# Patient Record
Sex: Female | Born: 1950
Health system: Southern US, Community
[De-identification: ages and names within clinical notes are randomized; demographics above are authoritative.]

## PROBLEM LIST (undated history)

## (undated) DIAGNOSIS — M539 Dorsopathy, unspecified: Secondary | ICD-10-CM

## (undated) DIAGNOSIS — K30 Functional dyspepsia: Secondary | ICD-10-CM

## (undated) DIAGNOSIS — I10 Essential (primary) hypertension: Secondary | ICD-10-CM

## (undated) DIAGNOSIS — R42 Dizziness and giddiness: Secondary | ICD-10-CM

## (undated) DIAGNOSIS — M1990 Unspecified osteoarthritis, unspecified site: Secondary | ICD-10-CM

## (undated) DIAGNOSIS — E119 Type 2 diabetes mellitus without complications: Secondary | ICD-10-CM

## (undated) DIAGNOSIS — U071 COVID-19: Secondary | ICD-10-CM

## (undated) DIAGNOSIS — M109 Gout, unspecified: Secondary | ICD-10-CM

## (undated) DIAGNOSIS — D649 Anemia, unspecified: Secondary | ICD-10-CM

## (undated) DIAGNOSIS — Z87898 Personal history of other specified conditions: Secondary | ICD-10-CM

## (undated) DIAGNOSIS — E7800 Pure hypercholesterolemia, unspecified: Secondary | ICD-10-CM

## (undated) HISTORY — DX: Gout, unspecified: M10.9

## (undated) HISTORY — DX: Type 2 diabetes mellitus without complications: E11.9

## (undated) HISTORY — DX: Unspecified osteoarthritis, unspecified site: M19.90

## (undated) HISTORY — DX: Essential (primary) hypertension: I10

## (undated) HISTORY — PX: INCONTINENCE SURGERY: SHX676

## (undated) HISTORY — PX: ABDOMINAL HYSTERECTOMY: SHX81

## (undated) HISTORY — DX: Functional dyspepsia: K30

## (undated) HISTORY — PX: CHOLECYSTECTOMY: SHX55

## (undated) HISTORY — DX: Personal history of other specified conditions: Z87.898

## (undated) HISTORY — PX: COLONOSCOPY: SHX174

---

## 2004-12-27 ENCOUNTER — Ambulatory Visit: Payer: Self-pay

## 2005-12-29 ENCOUNTER — Ambulatory Visit: Payer: Self-pay | Admitting: Family Medicine

## 2006-08-22 ENCOUNTER — Ambulatory Visit: Payer: Self-pay | Admitting: Family Medicine

## 2006-12-20 ENCOUNTER — Ambulatory Visit: Payer: Self-pay | Admitting: Family Medicine

## 2007-05-10 ENCOUNTER — Emergency Department: Payer: Self-pay | Admitting: Emergency Medicine

## 2007-05-10 ENCOUNTER — Other Ambulatory Visit: Payer: Self-pay

## 2007-05-14 ENCOUNTER — Ambulatory Visit: Payer: Self-pay | Admitting: Family Medicine

## 2007-10-16 ENCOUNTER — Ambulatory Visit: Payer: Self-pay | Admitting: Family Medicine

## 2007-10-18 ENCOUNTER — Ambulatory Visit: Payer: Self-pay | Admitting: Unknown Physician Specialty

## 2007-10-30 ENCOUNTER — Ambulatory Visit: Payer: Self-pay | Admitting: Family Medicine

## 2007-11-26 ENCOUNTER — Ambulatory Visit: Payer: Self-pay | Admitting: Unknown Physician Specialty

## 2007-12-24 ENCOUNTER — Ambulatory Visit: Payer: Self-pay | Admitting: Unknown Physician Specialty

## 2007-12-31 ENCOUNTER — Ambulatory Visit: Payer: Self-pay | Admitting: Unknown Physician Specialty

## 2008-01-01 ENCOUNTER — Ambulatory Visit: Payer: Self-pay | Admitting: Family Medicine

## 2008-10-25 ENCOUNTER — Emergency Department: Payer: Self-pay | Admitting: Internal Medicine

## 2009-01-20 ENCOUNTER — Ambulatory Visit: Payer: Self-pay | Admitting: Family Medicine

## 2009-02-02 ENCOUNTER — Ambulatory Visit: Payer: Self-pay | Admitting: Internal Medicine

## 2009-02-24 ENCOUNTER — Ambulatory Visit: Payer: Self-pay | Admitting: Internal Medicine

## 2009-03-04 ENCOUNTER — Ambulatory Visit: Payer: Self-pay | Admitting: Internal Medicine

## 2009-03-24 ENCOUNTER — Ambulatory Visit: Payer: Self-pay | Admitting: Internal Medicine

## 2009-04-04 ENCOUNTER — Ambulatory Visit: Payer: Self-pay | Admitting: Internal Medicine

## 2009-04-24 ENCOUNTER — Ambulatory Visit: Payer: Self-pay | Admitting: Internal Medicine

## 2009-06-02 ENCOUNTER — Ambulatory Visit: Payer: Self-pay | Admitting: Internal Medicine

## 2009-06-08 ENCOUNTER — Encounter: Payer: Self-pay | Admitting: Sports Medicine

## 2009-06-09 ENCOUNTER — Ambulatory Visit: Payer: Self-pay | Admitting: Sports Medicine

## 2009-06-16 ENCOUNTER — Ambulatory Visit: Payer: Self-pay | Admitting: Internal Medicine

## 2009-07-03 ENCOUNTER — Ambulatory Visit: Payer: Self-pay | Admitting: Internal Medicine

## 2009-08-02 ENCOUNTER — Ambulatory Visit: Payer: Self-pay | Admitting: Internal Medicine

## 2009-08-04 ENCOUNTER — Other Ambulatory Visit: Payer: Self-pay | Admitting: Internal Medicine

## 2009-08-28 ENCOUNTER — Other Ambulatory Visit: Payer: Self-pay | Admitting: Physician Assistant

## 2009-09-02 ENCOUNTER — Ambulatory Visit: Payer: Self-pay | Admitting: Internal Medicine

## 2009-09-08 ENCOUNTER — Ambulatory Visit: Payer: Self-pay | Admitting: Internal Medicine

## 2009-10-02 ENCOUNTER — Ambulatory Visit: Payer: Self-pay | Admitting: Internal Medicine

## 2009-10-26 ENCOUNTER — Emergency Department: Payer: Self-pay | Admitting: Emergency Medicine

## 2009-10-29 ENCOUNTER — Ambulatory Visit: Payer: Self-pay | Admitting: Internal Medicine

## 2009-10-30 ENCOUNTER — Ambulatory Visit: Payer: Self-pay | Admitting: Internal Medicine

## 2009-11-02 ENCOUNTER — Ambulatory Visit: Payer: Self-pay | Admitting: Internal Medicine

## 2009-12-01 ENCOUNTER — Ambulatory Visit: Payer: Self-pay | Admitting: Internal Medicine

## 2009-12-03 ENCOUNTER — Ambulatory Visit: Payer: Self-pay | Admitting: Internal Medicine

## 2009-12-14 ENCOUNTER — Other Ambulatory Visit: Payer: Self-pay | Admitting: Internal Medicine

## 2010-01-25 ENCOUNTER — Ambulatory Visit: Payer: Self-pay

## 2010-04-06 ENCOUNTER — Ambulatory Visit: Payer: Self-pay | Admitting: Internal Medicine

## 2010-04-06 ENCOUNTER — Other Ambulatory Visit: Payer: Self-pay | Admitting: Internal Medicine

## 2010-05-05 ENCOUNTER — Ambulatory Visit: Payer: Self-pay | Admitting: Internal Medicine

## 2010-06-23 ENCOUNTER — Ambulatory Visit: Payer: Self-pay | Admitting: Internal Medicine

## 2010-07-04 ENCOUNTER — Ambulatory Visit: Payer: Self-pay | Admitting: Internal Medicine

## 2010-08-03 ENCOUNTER — Ambulatory Visit: Payer: Self-pay | Admitting: Internal Medicine

## 2010-12-07 ENCOUNTER — Ambulatory Visit: Payer: Self-pay | Admitting: Internal Medicine

## 2011-01-03 ENCOUNTER — Ambulatory Visit: Payer: Self-pay | Admitting: Internal Medicine

## 2011-02-07 ENCOUNTER — Ambulatory Visit: Payer: Self-pay | Admitting: Internal Medicine

## 2011-07-09 ENCOUNTER — Other Ambulatory Visit: Payer: Self-pay | Admitting: Internal Medicine

## 2011-07-09 LAB — CBC WITH DIFFERENTIAL/PLATELET
Basophil #: 0.1 10*3/uL (ref 0.0–0.1)
Eosinophil #: 0.2 10*3/uL (ref 0.0–0.7)
HGB: 11.1 g/dL — ABNORMAL LOW (ref 12.0–16.0)
MCH: 28.1 pg (ref 26.0–34.0)
MCHC: 32 g/dL (ref 32.0–36.0)
MCV: 88 fL (ref 80–100)
Monocyte #: 0.6 10*3/uL (ref 0.0–0.7)
Monocyte %: 4.5 %
Neutrophil %: 64.8 %
Platelet: 426 10*3/uL (ref 150–440)
RBC: 3.95 10*6/uL (ref 3.80–5.20)
WBC: 13 10*3/uL — ABNORMAL HIGH (ref 3.6–11.0)

## 2011-07-09 LAB — BASIC METABOLIC PANEL
Anion Gap: 8 (ref 7–16)
BUN: 14 mg/dL (ref 7–18)
Calcium, Total: 9 mg/dL (ref 8.5–10.1)
Chloride: 101 mmol/L (ref 98–107)
Co2: 28 mmol/L (ref 21–32)
EGFR (African American): >60
Osmolality: 276 (ref 275–301)
Potassium: 4.4 mmol/L (ref 3.5–5.1)
Sodium: 137 mmol/L (ref 136–145)

## 2011-07-09 LAB — HEPATIC FUNCTION PANEL A (ARMC)
Albumin: 4.4 g/dL (ref 3.4–5.0)
Bilirubin, Direct: 0.1 mg/dL (ref 0.00–0.20)
Bilirubin,Total: 0.5 mg/dL (ref 0.2–1.0)
SGPT (ALT): 44 U/L
Total Protein: 8.1 g/dL (ref 6.4–8.2)

## 2011-07-09 LAB — LIPID PANEL
HDL Cholesterol: 55 mg/dL (ref 40–60)
Ldl Cholesterol, Calc: 70 mg/dL (ref 0–100)
VLDL Cholesterol, Calc: 28 mg/dL (ref 5–40)

## 2011-07-12 LAB — HEMOGLOBIN A1C

## 2011-07-15 ENCOUNTER — Emergency Department: Payer: Self-pay | Admitting: Internal Medicine

## 2011-07-15 LAB — URINALYSIS, COMPLETE
Bilirubin,UR: NEGATIVE
Blood: NEGATIVE
Hyaline Cast: 74
Ketone: NEGATIVE
Nitrite: NEGATIVE
Ph: 5 (ref 4.5–8.0)
RBC,UR: 9 /HPF (ref 0–5)
Specific Gravity: 1.02 (ref 1.003–1.030)

## 2011-07-15 LAB — CBC
HCT: 39.6 % (ref 35.0–47.0)
MCV: 86 fL (ref 80–100)
Platelet: 461 10*3/uL — ABNORMAL HIGH (ref 150–440)
RBC: 4.63 10*6/uL (ref 3.80–5.20)
RDW: 14.8 % — ABNORMAL HIGH (ref 11.5–14.5)
WBC: 12.5 10*3/uL — ABNORMAL HIGH (ref 3.6–11.0)

## 2011-07-15 LAB — COMPREHENSIVE METABOLIC PANEL
Alkaline Phosphatase: 134 U/L (ref 50–136)
BUN: 16 mg/dL (ref 7–18)
Bilirubin,Total: 0.9 mg/dL (ref 0.2–1.0)
Calcium, Total: 9.2 mg/dL (ref 8.5–10.1)
Chloride: 94 mmol/L — ABNORMAL LOW (ref 98–107)
Co2: 27 mmol/L (ref 21–32)
Creatinine: 0.85 mg/dL (ref 0.60–1.30)
EGFR (African American): >60
Glucose: 181 mg/dL — ABNORMAL HIGH (ref 65–99)
Osmolality: 272 (ref 275–301)
Potassium: 3.5 mmol/L (ref 3.5–5.1)
SGPT (ALT): 74 U/L
Sodium: 133 mmol/L — ABNORMAL LOW (ref 136–145)
Total Protein: 8 g/dL (ref 6.4–8.2)

## 2012-01-24 ENCOUNTER — Other Ambulatory Visit: Payer: Self-pay | Admitting: Internal Medicine

## 2012-01-24 LAB — CBC WITH DIFFERENTIAL/PLATELET
Basophil %: 0.6 %
Eosinophil #: 0.2 10*3/uL (ref 0.0–0.7)
Eosinophil %: 1.3 %
HCT: 36 % (ref 35.0–47.0)
HGB: 11.6 g/dL — ABNORMAL LOW (ref 12.0–16.0)
Lymphocyte #: 3.3 10*3/uL (ref 1.0–3.6)
MCH: 28.8 pg (ref 26.0–34.0)
MCV: 90 fL (ref 80–100)
Monocyte #: 0.5 x10 3/mm (ref 0.2–0.9)
Monocyte %: 4.6 %
Neutrophil #: 7.4 10*3/uL — ABNORMAL HIGH (ref 1.4–6.5)
Platelet: 391 10*3/uL (ref 150–440)
RBC: 4.02 10*6/uL (ref 3.80–5.20)
WBC: 11.4 10*3/uL — ABNORMAL HIGH (ref 3.6–11.0)

## 2012-01-24 LAB — TSH: Thyroid Stimulating Horm: 0.787 u[IU]/mL

## 2012-01-24 LAB — URINALYSIS, COMPLETE
Bilirubin,UR: NEGATIVE
Blood: NEGATIVE
Ketone: NEGATIVE
Leukocyte Esterase: NEGATIVE
Ph: 8 (ref 4.5–8.0)
RBC,UR: 1 /HPF (ref 0–5)
Specific Gravity: 1.012 (ref 1.003–1.030)
WBC UR: 1 /HPF (ref 0–5)

## 2012-01-24 LAB — HEPATIC FUNCTION PANEL A (ARMC)
Albumin: 4.1 g/dL (ref 3.4–5.0)
Alkaline Phosphatase: 109 U/L (ref 50–136)
SGOT(AST): 35 U/L (ref 15–37)
SGPT (ALT): 29 U/L (ref 12–78)
Total Protein: 7.6 g/dL (ref 6.4–8.2)

## 2012-01-24 LAB — BASIC METABOLIC PANEL
Anion Gap: 8 (ref 7–16)
BUN: 10 mg/dL (ref 7–18)
Calcium, Total: 9 mg/dL (ref 8.5–10.1)
Glucose: 129 mg/dL — ABNORMAL HIGH (ref 65–99)
Potassium: 4.1 mmol/L (ref 3.5–5.1)

## 2012-01-24 LAB — LIPID PANEL
HDL Cholesterol: 47 mg/dL (ref 40–60)
Triglycerides: 155 mg/dL (ref 0–200)
VLDL Cholesterol, Calc: 31 mg/dL (ref 5–40)

## 2012-01-24 LAB — HEMOGLOBIN A1C: Hemoglobin A1C: 8.4 % — ABNORMAL HIGH (ref 4.2–6.3)

## 2012-03-06 ENCOUNTER — Ambulatory Visit: Payer: Self-pay | Admitting: Internal Medicine

## 2012-03-07 ENCOUNTER — Ambulatory Visit: Payer: Self-pay | Admitting: Internal Medicine

## 2012-09-18 ENCOUNTER — Ambulatory Visit: Payer: Self-pay | Admitting: Surgery

## 2013-04-02 ENCOUNTER — Ambulatory Visit: Payer: Self-pay | Admitting: Internal Medicine

## 2014-01-02 DIAGNOSIS — G40109 Localization-related (focal) (partial) symptomatic epilepsy and epileptic syndromes with simple partial seizures, not intractable, without status epilepticus: Secondary | ICD-10-CM | POA: Insufficient documentation

## 2014-01-02 DIAGNOSIS — F419 Anxiety disorder, unspecified: Secondary | ICD-10-CM | POA: Insufficient documentation

## 2014-04-22 DIAGNOSIS — M5136 Other intervertebral disc degeneration, lumbar region: Secondary | ICD-10-CM | POA: Insufficient documentation

## 2014-04-22 DIAGNOSIS — M5416 Radiculopathy, lumbar region: Secondary | ICD-10-CM | POA: Insufficient documentation

## 2014-04-29 ENCOUNTER — Ambulatory Visit: Payer: Self-pay | Admitting: Physical Medicine and Rehabilitation

## 2014-04-29 ENCOUNTER — Ambulatory Visit: Payer: Self-pay | Admitting: Internal Medicine

## 2014-08-19 DIAGNOSIS — I10 Essential (primary) hypertension: Secondary | ICD-10-CM | POA: Insufficient documentation

## 2016-03-29 DIAGNOSIS — E119 Type 2 diabetes mellitus without complications: Secondary | ICD-10-CM | POA: Insufficient documentation

## 2016-04-11 ENCOUNTER — Ambulatory Visit: Payer: Medicare Other | Admitting: Oncology

## 2016-04-28 ENCOUNTER — Inpatient Hospital Stay: Payer: Medicare Other

## 2016-04-28 ENCOUNTER — Inpatient Hospital Stay: Payer: Medicare Other | Attending: Oncology | Admitting: Oncology

## 2016-04-28 ENCOUNTER — Encounter (INDEPENDENT_AMBULATORY_CARE_PROVIDER_SITE_OTHER): Payer: Self-pay

## 2016-04-28 ENCOUNTER — Encounter: Payer: Self-pay | Admitting: Oncology

## 2016-04-28 VITALS — BP 174/91 | HR 88 | Temp 95.7°F | Wt 148.7 lb

## 2016-04-28 DIAGNOSIS — I10 Essential (primary) hypertension: Secondary | ICD-10-CM | POA: Diagnosis not present

## 2016-04-28 DIAGNOSIS — M109 Gout, unspecified: Secondary | ICD-10-CM | POA: Insufficient documentation

## 2016-04-28 DIAGNOSIS — Z7982 Long term (current) use of aspirin: Secondary | ICD-10-CM

## 2016-04-28 DIAGNOSIS — D72820 Lymphocytosis (symptomatic): Secondary | ICD-10-CM | POA: Insufficient documentation

## 2016-04-28 DIAGNOSIS — D72829 Elevated white blood cell count, unspecified: Secondary | ICD-10-CM | POA: Diagnosis not present

## 2016-04-28 DIAGNOSIS — Z90710 Acquired absence of both cervix and uterus: Secondary | ICD-10-CM | POA: Insufficient documentation

## 2016-04-28 DIAGNOSIS — Z7984 Long term (current) use of oral hypoglycemic drugs: Secondary | ICD-10-CM | POA: Diagnosis not present

## 2016-04-28 DIAGNOSIS — Z79899 Other long term (current) drug therapy: Secondary | ICD-10-CM

## 2016-04-28 DIAGNOSIS — E119 Type 2 diabetes mellitus without complications: Secondary | ICD-10-CM | POA: Insufficient documentation

## 2016-04-28 DIAGNOSIS — D225 Melanocytic nevi of trunk: Secondary | ICD-10-CM | POA: Diagnosis not present

## 2016-04-28 LAB — DIFFERENTIAL
BASOS ABS: 0.1 10*3/uL (ref 0–0.1)
Basophils Relative: 1 %
EOS ABS: 0.1 10*3/uL (ref 0–0.7)
EOS PCT: 1 %
LYMPHS PCT: 34 %
Lymphs Abs: 4.9 10*3/uL — ABNORMAL HIGH (ref 1.0–3.6)
Monocytes Absolute: 0.8 10*3/uL (ref 0.2–0.9)
Monocytes Relative: 5 %
NEUTROS PCT: 59 %
Neutro Abs: 8.5 10*3/uL — ABNORMAL HIGH (ref 1.4–6.5)

## 2016-04-28 LAB — CBC
HCT: 38.3 % (ref 35.0–47.0)
HEMOGLOBIN: 12.6 g/dL (ref 12.0–16.0)
MCH: 30.1 pg (ref 26.0–34.0)
MCHC: 33 g/dL (ref 32.0–36.0)
MCV: 91.1 fL (ref 80.0–100.0)
PLATELETS: 416 10*3/uL (ref 150–440)
RBC: 4.2 MIL/uL (ref 3.80–5.20)
RDW: 13.2 % (ref 11.5–14.5)
WBC: 14.4 10*3/uL — AB (ref 3.6–11.0)

## 2016-04-28 NOTE — Progress Notes (Signed)
Patient here as a new patient

## 2016-04-28 NOTE — Progress Notes (Signed)
Hematology/Oncology Consult note [facility] [phone] [phone]  Patient Care Team: Tracie Harrier, MD as PCP - General (Internal Medicine)   Name of the patient: Erica Castillo  [id number]  Aug 21, 1950    Reason for referral- lymphocytosis   Referring physician- Dr. Ginette Pitman  Date of visit: 04/28/16   History of presenting illness- Patient is a 66 year old female who was been referred to [place] for evaluation of lymphocytosis. Patient has had a chronically elevated white count even dating back to 2013. CBC from 01/22/2012 showed white count of 11.4, H&H of 11.6/36 with a platelet count of 391. Differential was normal with mild neutrophilia. CBC from 05/13/2015 showed white count of 11.6 and most recent CBC from 03/22/2016 showed white count of 14.9 with an H&H of 12 point I/38.4 and a platelet count of 383. Over the last year patient has had relative lymphocytosis with absolute lymphocyte count between 4.9-5.15.  Overall she is doing well and reports no significant complaints. She is concerned about a mole on her upper abdomen which is slowly increasing in size over few months  ECOG PS- 1  Pain scale- 0   Review of systems- Review of Systems  Constitutional: Negative for chills, fever, malaise/fatigue and weight loss.  HENT: Negative for congestion, ear discharge and nosebleeds.   Eyes: Negative for blurred vision.  Respiratory: Negative for cough, hemoptysis, sputum production, shortness of breath and wheezing.   Cardiovascular: Negative for chest pain, palpitations, orthopnea and claudication.  Gastrointestinal: Negative for abdominal pain, blood in stool, constipation, diarrhea, heartburn, melena, nausea and vomiting.  Genitourinary: Negative for dysuria, flank pain, frequency, hematuria and urgency.  Musculoskeletal: Negative for back pain, joint pain and myalgias.  Skin: Negative for rash.       + skin mole  Neurological: Negative  for dizziness, tingling, focal weakness, seizures, weakness and headaches.  Endo/Heme/Allergies: Does not bruise/bleed easily.  Psychiatric/Behavioral: Negative for depression and suicidal ideas. The patient does not have insomnia.     Allergies not on file  There are no active problems to display for this patient.    Past Medical History:  Diagnosis Date  . Arthritis   . Diabetes mellitus without complication ([place])   . Gout   . History of seizures   . Hypertension   . Indigestion      Past Surgical History:  Procedure Laterality Date  . ABDOMINAL HYSTERECTOMY    . CHOLECYSTECTOMY    . INCONTINENCE SURGERY      Social History   Social History  . Marital status: Divorced    Spouse name: N/A  . Number of children: N/A  . Years of education: N/A   Occupational History  . Not on file.   Social History Main Topics  . Smoking status: Not on file  . Smokeless tobacco: Not on file  . Alcohol use Not on file  . Drug use: Unknown  . Sexual activity: Not on file   Other Topics Concern  . Not on file   Social History Narrative  . No narrative on file     Family History  Problem Relation Age of Onset  . Diabetes Mother   . Hypertension Mother   . Hypertension Father      Current Outpatient Prescriptions:  .  aspirin EC 81 MG tablet, Take 81 mg by mouth daily., Disp: , Rfl:  .  benazepril (LOTENSIN) 40 MG tablet, Take 40 mg by mouth daily., Disp: , Rfl:  .  colchicine 0.6 MG  tablet, Take 0.6 mg by mouth daily as needed., Disp: , Rfl:  .  diclofenac sodium (VOLTAREN) 1 % GEL, Apply 2 g topically daily as needed. , Disp: , Rfl:  .  ferrous sulfate 325 (65 FE) MG EC tablet, Take 325 mg by mouth daily with breakfast. , Disp: , Rfl:  .  glipiZIDE (GLUCOTROL XL) 10 MG 24 hr tablet, Take 10 mg by mouth daily with breakfast. , Disp: , Rfl:  .  levETIRAcetam (KEPPRA) 500 MG tablet, Take 500 mg by mouth 2 (two) times daily. , Disp: , Rfl:  .  metFORMIN (GLUCOPHAGE) 1000  MG tablet, Take 1,000 mg by mouth daily with breakfast. , Disp: , Rfl:  .  metoprolol succinate (TOPROL-XL) 25 MG 24 hr tablet, TAKE 1 TABLET BY MOUTH EVERY DAY, Disp: , Rfl:  .  omeprazole (PRILOSEC) 20 MG capsule, TAKE 1 CAPSULE(20 MG) BY MOUTH EVERY DAY, Disp: , Rfl:  .  simvastatin (ZOCOR) 20 MG tablet, TAKE 1 TABLET BY MOUTH NIGHTLY., Disp: , Rfl:  .  traMADol (ULTRAM) 50 MG tablet, 1/2-1 po bid prn, Disp: , Rfl:    Physical exam:  Vitals:   04/28/16 1547  BP: (!) 174/91  Pulse: 88  Temp: (!) 95.7 F (35.4 C)  TempSrc: Tympanic  Weight: 148 lb 11.2 oz (67.4 kg)   Physical Exam  Constitutional: She is oriented to person, place, and time and well-developed, well-nourished, and in no distress.  HENT:  Head: Normocephalic and atraumatic.  Eyes: EOM are normal. Pupils are equal, round, and reactive to light.  Neck: Normal range of motion.  Cardiovascular: Normal rate, regular rhythm and normal heart sounds.   Pulmonary/Chest: Effort normal and breath sounds normal.  Abdominal: Soft. Bowel sounds are normal.  No splenomegaly  Lymphadenopathy:  No palpable cervical, axillary or inguinal adenopathy  Neurological: She is alert and oriented to person, place, and time.  Skin: Skin is warm and dry.  Oval dark hyperpigmented nevus seen over upper abdominal wall about 1 cm in size       CMP Latest Ref Rng & Units 01/24/2012  Glucose 65 - 99 mg/dL 129(H)  BUN 7 - 18 mg/dL 10  Creatinine 0.60 - 1.30 mg/dL 0.85  Sodium 136 - 145 mmol/L 139  Potassium 3.5 - 5.1 mmol/L 4.1  Chloride 98 - 107 mmol/L 104  CO2 21 - 32 mmol/L 27  Calcium 8.5 - 10.1 mg/dL 9.0  Total Protein 6.4 - 8.2 g/dL 7.6  Total Bilirubin 0.2 - 1.0 mg/dL 0.8  Alkaline Phos 50 - 136 Unit/L 109  AST 15 - 37 Unit/L 35  ALT 12 - 78 U/L 29   CBC Latest Ref Rng & Units 01/24/2012  WBC 3.6 - 11.0 x10 3/mm 3 11.4(H)  Hemoglobin 12.0 - 16.0 g/dL 11.6(L)  Hematocrit 35.0 - 47.0 % 36.0  Platelets 150 - 440 x10 3/mm 3 391      Assessment and plan- Patient is a 66 y.o. female who has been referred to [place] for evaluation and management of leukocytosis/lymphocytosis  1. Today we will obtain CBC with manual differential as well as a peripheral flow cytometry to rule out the clonal process such as CLL. We will also get a pathologic review of her smear. I will see her back in 2 weeks' time to discuss the results of her blood work. If this turns out to be CLL she's has rai stage 0 CLL given absence of cytopenias, B symptoms and palpable splenomegaly and adenopathy.  2. Skin mole- patient will touch base with dermatology soon given that it is increasing in size  3. Hypertension- reports BP reading high in office but better controleld at home   Total face to face encounter time for this patient visit was 30 min. >50% of the time was  spent in counseling and coordination of care.     Thank you for this kind referral and the opportunity to participate in the care of this patient   Visit Diagnosis 1. Lymphocytosis   2. Hypertension, unspecified type     Dr. Randa Evens, MD, MPH [facility] at [facility] Pager- [phone] 04/28/2016

## 2016-04-29 LAB — PATHOLOGIST SMEAR REVIEW

## 2016-05-04 LAB — COMP PANEL: LEUKEMIA/LYMPHOMA

## 2016-05-12 ENCOUNTER — Ambulatory Visit: Payer: Medicare Other | Admitting: Oncology

## 2016-05-19 ENCOUNTER — Inpatient Hospital Stay: Payer: Medicare Other | Attending: Oncology | Admitting: Oncology

## 2016-05-19 ENCOUNTER — Telehealth: Payer: Self-pay | Admitting: *Deleted

## 2016-05-19 VITALS — BP 189/106 | HR 97 | Temp 96.2°F | Resp 18 | Wt 151.3 lb

## 2016-05-19 DIAGNOSIS — M109 Gout, unspecified: Secondary | ICD-10-CM | POA: Insufficient documentation

## 2016-05-19 DIAGNOSIS — Z79899 Other long term (current) drug therapy: Secondary | ICD-10-CM | POA: Insufficient documentation

## 2016-05-19 DIAGNOSIS — I10 Essential (primary) hypertension: Secondary | ICD-10-CM | POA: Insufficient documentation

## 2016-05-19 DIAGNOSIS — Z7984 Long term (current) use of oral hypoglycemic drugs: Secondary | ICD-10-CM | POA: Insufficient documentation

## 2016-05-19 DIAGNOSIS — D72829 Elevated white blood cell count, unspecified: Secondary | ICD-10-CM

## 2016-05-19 DIAGNOSIS — R0981 Nasal congestion: Secondary | ICD-10-CM | POA: Diagnosis not present

## 2016-05-19 DIAGNOSIS — D229 Melanocytic nevi, unspecified: Secondary | ICD-10-CM | POA: Insufficient documentation

## 2016-05-19 DIAGNOSIS — D72820 Lymphocytosis (symptomatic): Secondary | ICD-10-CM | POA: Diagnosis not present

## 2016-05-19 DIAGNOSIS — E119 Type 2 diabetes mellitus without complications: Secondary | ICD-10-CM | POA: Insufficient documentation

## 2016-05-19 DIAGNOSIS — Z7982 Long term (current) use of aspirin: Secondary | ICD-10-CM | POA: Diagnosis not present

## 2016-05-19 DIAGNOSIS — Z90710 Acquired absence of both cervix and uterus: Secondary | ICD-10-CM | POA: Insufficient documentation

## 2016-05-19 DIAGNOSIS — D649 Anemia, unspecified: Secondary | ICD-10-CM | POA: Insufficient documentation

## 2016-05-19 NOTE — Progress Notes (Signed)
Here for f/u stated she is doing well.

## 2016-05-19 NOTE — Telephone Encounter (Signed)
Called Dr.Hande and spoke to staff and told them that pt's b/p elevated today 189/106 then repeat after visit 187/101. Pt did have sinus pressure yest and took a guaifenesin and then felt some better today.  Her last vsit on 1/25 it was elevated 174/91 and she is on 2 b/p meds.  She states  She has appt in March but MD felt that she may need something different or change in meds with b/p elevation.  Asked that Dr. Ginette Pitman review the info and call pt with plan. Patient was told that I am calling PCP to tell him above and she was agreeable to this

## 2016-05-19 NOTE — Progress Notes (Signed)
Hematology/Oncology Consult note [facility]  [phone] [phone]  Patient Care Team: Tracie Harrier, MD as PCP - General (Internal Medicine)   Name of the patient: Erica Castillo  [id number]  [birth year]   Date of visit: 05/19/16  Diagnosis- leucocytosis and lymphocytosis likely reactive.  Chief complaint/ Reason for visit- discuss results of bloodowork  Heme/Onc history: Patient is a 66 year old female who was been referred to [place] for evaluation of lymphocytosis. Patient has had a chronically elevated white count even dating back to 2013. CBC from 01/22/2012 showed white count of 11.4, H&H of 11.6/36 with a platelet count of 391. Differential was normal with mild neutrophilia. CBC from 05/13/2015 showed white count of 11.6 and most recent CBC from 03/22/2016 showed white count of 14.9 with an H&H of 12 point I/38.4 and a platelet count of 383. Over the last year patient has had relative lymphocytosis with absolute lymphocyte count between 4.9-5.15.  CBC from 04/28/2016 showed white count of 14.4, H&H of 12.6/38.3 and a platelet count of 416. Differential mainly showed neutrophilia and lymphocytosis.  Pathology review of smear showed: Mild normocytic anemia with normal red cell morphology.  Leukocytosis with absolute neutrophilia and lymphocytosis. Immature forms and myelocyte bulge are not identified.  Normal platelet morphology.   Peripheral flow cytometry did not reveal any immunophenotyping abnormality  Interval history- she does report some problems with sinus congestion. Denies any fevers She is yet to see a dermatologist for her skin mole  Review of systems- Review of Systems  Constitutional: Negative for chills, fever, malaise/fatigue and weight loss.  HENT: Positive for sinus pain. Negative for congestion, ear discharge and nosebleeds.   Eyes: Negative for blurred vision.  Respiratory: Negative for cough, hemoptysis, sputum  production, shortness of breath and wheezing.   Cardiovascular: Negative for chest pain, palpitations, orthopnea and claudication.  Gastrointestinal: Negative for abdominal pain, blood in stool, constipation, diarrhea, heartburn, melena, nausea and vomiting.  Genitourinary: Negative for dysuria, flank pain, frequency, hematuria and urgency.  Musculoskeletal: Negative for back pain, joint pain and myalgias.  Skin: Negative for rash.  Neurological: Negative for dizziness, tingling, focal weakness, seizures, weakness and headaches.  Endo/Heme/Allergies: Does not bruise/bleed easily.  Psychiatric/Behavioral: Negative for depression and suicidal ideas. The patient does not have insomnia.      Current treatment- observation  No Known Allergies   Past Medical History:  Diagnosis Date  . Arthritis   . Diabetes mellitus without complication ([place])   . Gout   . History of seizures   . Hypertension   . Indigestion      Past Surgical History:  Procedure Laterality Date  . ABDOMINAL HYSTERECTOMY    . CHOLECYSTECTOMY    . INCONTINENCE SURGERY      Social History   Social History  . Marital status: Divorced    Spouse name: N/A  . Number of children: N/A  . Years of education: N/A   Occupational History  . Not on file.   Social History Main Topics  . Smoking status: Never Smoker  . Smokeless tobacco: Never Used  . Alcohol use No  . Drug use: No  . Sexual activity: Not on file   Other Topics Concern  . Not on file   Social History Narrative  . No narrative on file    Family History  Problem Relation Age of Onset  . Diabetes Mother   . Hypertension Mother   . Hypertension Father      Current Outpatient  Prescriptions:  .  aspirin EC 81 MG tablet, Take 81 mg by mouth daily., Disp: , Rfl:  .  benazepril (LOTENSIN) 40 MG tablet, Take 40 mg by mouth daily., Disp: , Rfl:  .  ferrous sulfate 325 (65 FE) MG EC tablet, Take 325 mg by mouth daily with breakfast. , Disp: ,  Rfl:  .  glipiZIDE (GLUCOTROL XL) 10 MG 24 hr tablet, Take 10 mg by mouth daily with breakfast. , Disp: , Rfl:  .  levETIRAcetam (KEPPRA) 500 MG tablet, Take 500 mg by mouth 2 (two) times daily. , Disp: , Rfl:  .  metFORMIN (GLUCOPHAGE) 1000 MG tablet, Take 1,000 mg by mouth daily with breakfast. , Disp: , Rfl:  .  metoprolol succinate (TOPROL-XL) 25 MG 24 hr tablet, TAKE 1 TABLET BY MOUTH EVERY DAY, Disp: , Rfl:  .  omeprazole (PRILOSEC) 20 MG capsule, TAKE 1 CAPSULE(20 MG) BY MOUTH EVERY DAY, Disp: , Rfl:  .  simvastatin (ZOCOR) 20 MG tablet, TAKE 1 TABLET BY MOUTH NIGHTLY., Disp: , Rfl:  .  colchicine 0.6 MG tablet, Take 0.6 mg by mouth daily as needed., Disp: , Rfl:  .  traMADol (ULTRAM) 50 MG tablet, 1/2-1 po bid prn, Disp: , Rfl:   Physical exam:  Vitals:   05/19/16 1508  BP: (!) 189/106  Pulse: 97  Resp: 18  Temp: (!) 96.2 F (35.7 C)  TempSrc: Tympanic  Weight: 151 lb 5.5 oz (68.6 kg)   Physical Exam  Constitutional: She is oriented to person, place, and time and well-developed, well-nourished, and in no distress.  HENT:  Head: Normocephalic and atraumatic.  Eyes: EOM are normal. Pupils are equal, round, and reactive to light.  Neck: Normal range of motion.  Cardiovascular: Normal rate, regular rhythm and normal heart sounds.   Pulmonary/Chest: Effort normal and breath sounds normal.  Abdominal: Soft. Bowel sounds are normal.  Neurological: She is alert and oriented to person, place, and time.  Skin: Skin is warm and dry.     CMP Latest Ref Rng & Units 01/24/2012  Glucose 65 - 99 mg/dL 129(H)  BUN 7 - 18 mg/dL 10  Creatinine 0.60 - 1.30 mg/dL 0.85  Sodium 136 - 145 mmol/L 139  Potassium 3.5 - 5.1 mmol/L 4.1  Chloride 98 - 107 mmol/L 104  CO2 21 - 32 mmol/L 27  Calcium 8.5 - 10.1 mg/dL 9.0  Total Protein 6.4 - 8.2 g/dL 7.6  Total Bilirubin 0.2 - 1.0 mg/dL 0.8  Alkaline Phos 50 - 136 Unit/L 109  AST 15 - 37 Unit/L 35  ALT 12 - 78 U/L 29   CBC Latest Ref Rng &  Units 04/28/2016  WBC 3.6 - 11.0 K/uL 14.4(H)  Hemoglobin 12.0 - 16.0 g/dL 12.6  Hematocrit 35.0 - 47.0 % 38.3  Platelets 150 - 440 K/uL 416    Assessment and plan- Patient is a 66 y.o. female referred to [place] for leukocytosis mainly neutrophilia and lymphocytosis  1. I discussed the results of the blood work with the patient. Peripheral flow cytometry did not reveal any clonal immunophenotyping abnormality in the cells or T cells. There was no evidence of CML features and are pathologically smear review as well. At this time I am inclined to monitor her CBC conservatively without any further invasive workup such as a bone marrow biopsy as she does not have any concomitant cytopenias. I will see her back in 3 months time with a repeat CBC and I will do BCR abl testing  at that time  2. HTN- repeat check still showed SBP in 180s. Dr. Kerby Moors office notified. Patient is asymptomatic  3. Skin mole- patient plans to see dermatology soon   Visit Diagnosis 1. Lymphocytosis      Dr. Randa Evens, MD, MPH [facility] at [facility] Pager- [phone] 05/19/2016 3:37 PM

## 2016-05-20 NOTE — Addendum Note (Signed)
Addended by: Luella Cook on: 05/20/2016 12:39 PM   Modules accepted: Orders

## 2016-06-02 ENCOUNTER — Other Ambulatory Visit: Payer: Self-pay | Admitting: Internal Medicine

## 2016-06-02 DIAGNOSIS — Z1231 Encounter for screening mammogram for malignant neoplasm of breast: Secondary | ICD-10-CM

## 2016-07-05 ENCOUNTER — Ambulatory Visit
Admission: RE | Admit: 2016-07-05 | Discharge: 2016-07-05 | Disposition: A | Discharge status: Home / Self Care | Payer: Medicare Other | Source: Ambulatory Visit | Attending: Internal Medicine | Admitting: Internal Medicine

## 2016-07-05 ENCOUNTER — Encounter: Payer: Self-pay | Admitting: Radiology

## 2016-07-05 DIAGNOSIS — Z1231 Encounter for screening mammogram for malignant neoplasm of breast: Secondary | ICD-10-CM | POA: Insufficient documentation

## 2016-07-06 DIAGNOSIS — E119 Type 2 diabetes mellitus without complications: Secondary | ICD-10-CM | POA: Insufficient documentation

## 2016-08-18 ENCOUNTER — Inpatient Hospital Stay: Payer: Medicare Other | Attending: Oncology

## 2016-08-18 ENCOUNTER — Ambulatory Visit: Payer: Medicare Other | Admitting: Oncology

## 2016-08-18 ENCOUNTER — Inpatient Hospital Stay (HOSPITAL_BASED_OUTPATIENT_CLINIC_OR_DEPARTMENT_OTHER): Payer: Medicare Other | Admitting: Oncology

## 2016-08-18 ENCOUNTER — Other Ambulatory Visit: Payer: Medicare Other

## 2016-08-18 VITALS — BP 166/84 | HR 79 | Temp 96.0°F | Resp 18 | Wt 148.4 lb

## 2016-08-18 DIAGNOSIS — M109 Gout, unspecified: Secondary | ICD-10-CM | POA: Diagnosis not present

## 2016-08-18 DIAGNOSIS — Z7982 Long term (current) use of aspirin: Secondary | ICD-10-CM

## 2016-08-18 DIAGNOSIS — Z7984 Long term (current) use of oral hypoglycemic drugs: Secondary | ICD-10-CM | POA: Insufficient documentation

## 2016-08-18 DIAGNOSIS — Z79899 Other long term (current) drug therapy: Secondary | ICD-10-CM

## 2016-08-18 DIAGNOSIS — D72829 Elevated white blood cell count, unspecified: Secondary | ICD-10-CM

## 2016-08-18 DIAGNOSIS — I10 Essential (primary) hypertension: Secondary | ICD-10-CM | POA: Diagnosis not present

## 2016-08-18 DIAGNOSIS — D649 Anemia, unspecified: Secondary | ICD-10-CM

## 2016-08-18 DIAGNOSIS — D729 Disorder of white blood cells, unspecified: Secondary | ICD-10-CM

## 2016-08-18 DIAGNOSIS — E119 Type 2 diabetes mellitus without complications: Secondary | ICD-10-CM

## 2016-08-18 DIAGNOSIS — D72820 Lymphocytosis (symptomatic): Secondary | ICD-10-CM | POA: Diagnosis present

## 2016-08-18 LAB — CBC
HCT: 35.8 % (ref 35.0–47.0)
HEMOGLOBIN: 12 g/dL (ref 12.0–16.0)
MCH: 30.3 pg (ref 26.0–34.0)
MCHC: 33.7 g/dL (ref 32.0–36.0)
MCV: 89.9 fL (ref 80.0–100.0)
Platelets: 338 10*3/uL (ref 150–440)
RBC: 3.98 MIL/uL (ref 3.80–5.20)
RDW: 12.9 % (ref 11.5–14.5)
WBC: 12.5 10*3/uL — ABNORMAL HIGH (ref 3.6–11.0)

## 2016-08-18 NOTE — Progress Notes (Signed)
Here for follow up

## 2016-08-19 ENCOUNTER — Encounter: Payer: Self-pay | Admitting: Oncology

## 2016-08-19 NOTE — Progress Notes (Signed)
Hematology/Oncology Consult note [facility]  [phone] [phone]  Patient Care Team: Tracie Harrier, MD as PCP - General (Internal Medicine)   Name of the patient: Erica Castillo  [id number]  [birth year]   Date of visit: 08/19/16  Diagnosis- chronic neutrophilia likely reactive  Chief complaint/ Reason for visit- routine f/u  Heme/Onc history: Patient is a 66 year old female who was been referred to [place] for evaluation of lymphocytosis. Patient has had a chronically elevated white count even dating back to 2013. CBC from 01/22/2012 showed white count of 11.4, H&H of 11.6/36 with a platelet count of 391. Differential was normal with mild neutrophilia. CBC from 05/13/2015 showed white count of 11.6 and most recent CBC from 03/22/2016 showed white count of 14.9 with an H&H of 12 point I/38.4 and a platelet count of 383. Over the last year patient has had relative lymphocytosis with absolute lymphocyte count between 4.9-5.15.  CBC from 04/28/2016 showed white count of 14.4, H&H of 12.6/38.3 and a platelet count of 416. Differential mainly showed neutrophilia and lymphocytosis.  Pathology review of smear showed: Mild normocytic anemia with normal red cell morphology.  Leukocytosis with absolute neutrophilia and lymphocytosis. Immature forms and myelocyte bulge are not identified.  Normal platelet morphology.   Peripheral flow cytometry did not reveal any immunophenotyping abnormality   Interval history- doing well. Denies any complaints   Review of systems- Review of Systems  Constitutional: Negative for chills, fever, malaise/fatigue and weight loss.  HENT: Negative for congestion, ear discharge and nosebleeds.   Eyes: Negative for blurred vision.  Respiratory: Negative for cough, hemoptysis, sputum production, shortness of breath and wheezing.   Cardiovascular: Negative for chest pain, palpitations, orthopnea and claudication.    Gastrointestinal: Negative for abdominal pain, blood in stool, constipation, diarrhea, heartburn, melena, nausea and vomiting.  Genitourinary: Negative for dysuria, flank pain, frequency, hematuria and urgency.  Musculoskeletal: Negative for back pain, joint pain and myalgias.  Skin: Negative for rash.  Neurological: Negative for dizziness, tingling, focal weakness, seizures, weakness and headaches.  Endo/Heme/Allergies: Does not bruise/bleed easily.  Psychiatric/Behavioral: Negative for depression and suicidal ideas. The patient does not have insomnia.       No Known Allergies   Past Medical History:  Diagnosis Date  . Arthritis   . Diabetes mellitus without complication ([place])   . Gout   . History of seizures   . Hypertension   . Indigestion      Past Surgical History:  Procedure Laterality Date  . ABDOMINAL HYSTERECTOMY    . CHOLECYSTECTOMY    . INCONTINENCE SURGERY      Social History   Social History  . Marital status: Divorced    Spouse name: N/A  . Number of children: N/A  . Years of education: N/A   Occupational History  . Not on file.   Social History Main Topics  . Smoking status: Never Smoker  . Smokeless tobacco: Never Used  . Alcohol use No  . Drug use: No  . Sexual activity: Not on file   Other Topics Concern  . Not on file   Social History Narrative  . No narrative on file    Family History  Problem Relation Age of Onset  . Diabetes Mother   . Hypertension Mother   . Hypertension Father   . Breast cancer Neg Hx      Current Outpatient Prescriptions:  .  aspirin EC 81 MG tablet, Take 81 mg by mouth daily., Disp: ,  Rfl:  .  benazepril (LOTENSIN) 40 MG tablet, Take 40 mg by mouth daily., Disp: , Rfl:  .  benazepril (LOTENSIN) 40 MG tablet, TAKE 1 TABLET(40 MG) BY MOUTH EVERY DAY, Disp: , Rfl:  .  ferrous sulfate 325 (65 FE) MG EC tablet, Take 325 mg by mouth daily with breakfast. , Disp: , Rfl:  .  glipiZIDE (GLUCOTROL XL) 10 MG 24 hr  tablet, Take 10 mg by mouth daily with breakfast. , Disp: , Rfl:  .  levETIRAcetam (KEPPRA) 500 MG tablet, Take 500 mg by mouth 2 (two) times daily. , Disp: , Rfl:  .  meloxicam (MOBIC) 7.5 MG tablet, , Disp: , Rfl: 5 .  metFORMIN (GLUCOPHAGE) 1000 MG tablet, Take 1,000 mg by mouth daily with breakfast. , Disp: , Rfl:  .  metoprolol succinate (TOPROL-XL) 25 MG 24 hr tablet, TAKE 1 TABLET BY MOUTH EVERY DAY, Disp: , Rfl:  .  omeprazole (PRILOSEC) 20 MG capsule, TAKE 1 CAPSULE(20 MG) BY MOUTH EVERY DAY, Disp: , Rfl:  .  simvastatin (ZOCOR) 20 MG tablet, TAKE 1 TABLET BY MOUTH NIGHTLY., Disp: , Rfl:  .  colchicine 0.6 MG tablet, Take 0.6 mg by mouth daily as needed., Disp: , Rfl:  .  traMADol (ULTRAM) 50 MG tablet, 1/2-1 po bid prn, Disp: , Rfl:   Physical exam:  Vitals:   08/18/16 1543  BP: (!) 166/84  Pulse: 79  Resp: 18  Temp: (!) 96 F (35.6 C)  TempSrc: Tympanic  Weight: 148 lb 6.4 oz (67.3 kg)   Physical Exam  Constitutional: She is oriented to person, place, and time and well-developed, well-nourished, and in no distress.  HENT:  Head: Normocephalic and atraumatic.  Eyes: EOM are normal. Pupils are equal, round, and reactive to light.  Neck: Normal range of motion.  Cardiovascular: Normal rate, regular rhythm and normal heart sounds.   Pulmonary/Chest: Effort normal and breath sounds normal.  Abdominal: Soft. Bowel sounds are normal.  Neurological: She is alert and oriented to person, place, and time.  Skin: Skin is warm and dry.     CMP Latest Ref Rng & Units 01/24/2012  Glucose 65 - 99 mg/dL 129(H)  BUN 7 - 18 mg/dL 10  Creatinine 0.60 - 1.30 mg/dL 0.85  Sodium 136 - 145 mmol/L 139  Potassium 3.5 - 5.1 mmol/L 4.1  Chloride 98 - 107 mmol/L 104  CO2 21 - 32 mmol/L 27  Calcium 8.5 - 10.1 mg/dL 9.0  Total Protein 6.4 - 8.2 g/dL 7.6  Total Bilirubin 0.2 - 1.0 mg/dL 0.8  Alkaline Phos 50 - 136 Unit/L 109  AST 15 - 37 Unit/L 35  ALT 12 - 78 U/L 29   CBC Latest Ref Rng  & Units 08/18/2016  WBC 3.6 - 11.0 K/uL 12.5(H)  Hemoglobin 12.0 - 16.0 g/dL 12.0  Hematocrit 35.0 - 47.0 % 35.8  Platelets 150 - 440 K/uL 338     Assessment and plan- Patient is a 66 y.o. female referred to [place] for leucocytosis/neutrophilia  Overall patient has mild leukocytosis/neutrophilia which waxes and wanes and has been chronic at least dating back to 2008. This is not associated with any other cytopenias. Bcr abl testing pending from today. Flow cytometry did not reval any lymphoma or leukemia. This is likely reactive and can be monitored conservatively without a bone marrow biopsy. She will have repeat cbc with Dr. Ginette Pitman in 6 months and I will see her back in 1 year  HTN- remains uncontrolled and  needs to be followed by Dr. Ginette Pitman   Visit Diagnosis 1. Neutrophilia      Dr. Randa Evens, MD, MPH [place] at [facility] Pager- [phone] 08/19/2016 8:00 AM

## 2016-08-23 LAB — BCR-ABL1, CML/ALL, PCR, QUANT

## 2017-04-26 ENCOUNTER — Telehealth: Payer: Self-pay | Admitting: Oncology

## 2017-04-26 NOTE — Telephone Encounter (Signed)
MD appt rschd per MD on PAL 08/17/17.  L/M on V/M. Also mailed updated appt.

## 2017-04-26 NOTE — Telephone Encounter (Signed)
Rschd per MD on PAL. L/M on V/M. Updated appt also mailed. MF

## 2017-06-19 ENCOUNTER — Other Ambulatory Visit: Payer: Self-pay | Admitting: Internal Medicine

## 2017-06-19 DIAGNOSIS — Z1231 Encounter for screening mammogram for malignant neoplasm of breast: Secondary | ICD-10-CM

## 2017-07-06 ENCOUNTER — Ambulatory Visit
Admission: RE | Admit: 2017-07-06 | Discharge: 2017-07-06 | Disposition: A | Discharge status: Home / Self Care | Payer: Medicare Other | Source: Ambulatory Visit | Attending: Internal Medicine | Admitting: Internal Medicine

## 2017-07-06 DIAGNOSIS — Z1231 Encounter for screening mammogram for malignant neoplasm of breast: Secondary | ICD-10-CM | POA: Insufficient documentation

## 2017-07-10 ENCOUNTER — Other Ambulatory Visit: Payer: Self-pay | Admitting: Internal Medicine

## 2017-07-10 DIAGNOSIS — R928 Other abnormal and inconclusive findings on diagnostic imaging of breast: Secondary | ICD-10-CM

## 2017-07-10 DIAGNOSIS — N6489 Other specified disorders of breast: Secondary | ICD-10-CM

## 2017-07-27 ENCOUNTER — Ambulatory Visit
Admission: RE | Admit: 2017-07-27 | Discharge: 2017-07-27 | Disposition: A | Discharge status: Home / Self Care | Payer: Medicare Other | Source: Ambulatory Visit | Attending: Internal Medicine | Admitting: Internal Medicine

## 2017-07-27 DIAGNOSIS — N6489 Other specified disorders of breast: Secondary | ICD-10-CM | POA: Insufficient documentation

## 2017-07-27 DIAGNOSIS — R928 Other abnormal and inconclusive findings on diagnostic imaging of breast: Secondary | ICD-10-CM

## 2017-08-17 ENCOUNTER — Ambulatory Visit: Payer: Medicare Other | Admitting: Oncology

## 2017-08-31 ENCOUNTER — Other Ambulatory Visit: Payer: Self-pay | Admitting: *Deleted

## 2017-08-31 DIAGNOSIS — D729 Disorder of white blood cells, unspecified: Secondary | ICD-10-CM

## 2017-09-05 ENCOUNTER — Ambulatory Visit: Payer: Medicare Other | Admitting: Oncology

## 2017-09-21 ENCOUNTER — Ambulatory Visit: Payer: Medicare Other | Admitting: Oncology

## 2017-10-03 ENCOUNTER — Other Ambulatory Visit: Payer: Self-pay

## 2017-10-03 ENCOUNTER — Inpatient Hospital Stay: Payer: Medicare Other | Attending: Oncology | Admitting: Oncology

## 2017-10-03 ENCOUNTER — Encounter: Payer: Self-pay | Admitting: Oncology

## 2017-10-03 VITALS — BP 157/72 | HR 74 | Temp 96.2°F | Resp 18 | Wt 148.0 lb

## 2017-10-03 DIAGNOSIS — D72820 Lymphocytosis (symptomatic): Secondary | ICD-10-CM

## 2017-10-03 DIAGNOSIS — I10 Essential (primary) hypertension: Secondary | ICD-10-CM | POA: Diagnosis not present

## 2017-10-03 DIAGNOSIS — D72829 Elevated white blood cell count, unspecified: Secondary | ICD-10-CM | POA: Insufficient documentation

## 2017-10-03 DIAGNOSIS — E119 Type 2 diabetes mellitus without complications: Secondary | ICD-10-CM | POA: Diagnosis not present

## 2017-10-03 DIAGNOSIS — D649 Anemia, unspecified: Secondary | ICD-10-CM | POA: Insufficient documentation

## 2017-10-03 NOTE — Progress Notes (Signed)
Hematology/Oncology Consult note [facility]  [phone] [phone]  Patient Care Team: Tracie Harrier, MD as PCP - General (Internal Medicine)   Name of the patient: Erica Castillo  [id number]  [birth year]   Date of visit: 10/03/17  Diagnosis- leucocytosis/ lymphocytosis likely reactive  Chief complaint/ Reason for visit- routine f/u of leucocytosis  Heme/Onc history: Patient is a 67 year old female who was been referred to [place] for evaluation of lymphocytosis. Patient has had a chronically elevated white count even dating back to 2013. CBC from 01/22/2012 showed white count of 11.4, H&H of 11.6/36 with a platelet count of 391. Differential was normal with mild neutrophilia. CBC from 05/13/2015 showed white count of 11.6 and most recent CBC from 03/22/2016 showed white count of 14.9 with an H&H of 12 point I/38.4 and a platelet count of 383. Over the last year patient has had relative lymphocytosis with absolute lymphocyte count between 4.9-5.15.  CBC from 04/28/2016 showed white count of 14.4, H&H of 12.6/38.3 and a platelet count of 416. Differential mainly showed neutrophilia and lymphocytosis.  Pathology review of smear showed: Mild normocytic anemia with normal red cell morphology.  Leukocytosis with absolute neutrophilia and lymphocytosis. Immature forms and myelocyte bulge are not identified.  Normal platelet morphology.   Peripheral flow cytometry did not reveal any immunophenotyping abnormality. BCR abl testing normal    Interval history- patient reports doing well. Denies any complaints. No fevers, unintentional weight loss or night sweats  ECOG PS- 1 Pain scale- 0   Review of systems- Review of Systems  Constitutional: Negative for chills, fever, malaise/fatigue and weight loss.  HENT: Negative for congestion, ear discharge and nosebleeds.   Eyes: Negative for blurred vision.  Respiratory: Negative for cough,  hemoptysis, sputum production, shortness of breath and wheezing.   Cardiovascular: Negative for chest pain, palpitations, orthopnea and claudication.  Gastrointestinal: Negative for abdominal pain, blood in stool, constipation, diarrhea, heartburn, melena, nausea and vomiting.  Genitourinary: Negative for dysuria, flank pain, frequency, hematuria and urgency.  Musculoskeletal: Negative for back pain, joint pain and myalgias.  Skin: Negative for rash.  Neurological: Negative for dizziness, tingling, focal weakness, seizures, weakness and headaches.  Endo/Heme/Allergies: Does not bruise/bleed easily.  Psychiatric/Behavioral: Negative for depression and suicidal ideas. The patient does not have insomnia.       No Known Allergies   Past Medical History:  Diagnosis Date  . Arthritis   . Diabetes mellitus without complication ([place])   . Gout   . History of seizures   . Hypertension   . Indigestion      Past Surgical History:  Procedure Laterality Date  . ABDOMINAL HYSTERECTOMY    . CHOLECYSTECTOMY    . INCONTINENCE SURGERY      Social History   Socioeconomic History  . Marital status: Divorced    Spouse name: Not on file  . Number of children: Not on file  . Years of education: Not on file  . Highest education level: Not on file  Occupational History  . Not on file  Social Needs  . Financial resource strain: Not on file  . Food insecurity:    Worry: Not on file    Inability: Not on file  . Transportation needs:    Medical: Not on file    Non-medical: Not on file  Tobacco Use  . Smoking status: Never Smoker  . Smokeless tobacco: Never Used  Substance and Sexual Activity  . Alcohol use: No  . Drug use:  No  . Sexual activity: Not on file  Lifestyle  . Physical activity:    Days per week: Not on file    Minutes per session: Not on file  . Stress: Not on file  Relationships  . Social connections:    Talks on phone: Not on file    Gets together: Not on file     Attends religious service: Not on file    Active member of club or organization: Not on file    Attends meetings of clubs or organizations: Not on file    Relationship status: Not on file  . Intimate partner violence:    Fear of current or ex partner: Not on file    Emotionally abused: Not on file    Physically abused: Not on file    Forced sexual activity: Not on file  Other Topics Concern  . Not on file  Social History Narrative  . Not on file    Family History  Problem Relation Age of Onset  . Diabetes Mother   . Hypertension Mother   . Hypertension Father   . Breast cancer Neg Hx      Current Outpatient Medications:  .  aspirin EC 81 MG tablet, Take 81 mg by mouth daily., Disp: , Rfl:  .  benazepril (LOTENSIN) 40 MG tablet, Take 40 mg by mouth daily., Disp: , Rfl:  .  dapagliflozin propanediol (FARXIGA) 5 MG TABS tablet, Take 1 tablet by mouth every morning, Disp: , Rfl:  .  glipiZIDE (GLUCOTROL XL) 10 MG 24 hr tablet, Take 10 mg by mouth daily with breakfast. , Disp: , Rfl:  .  JANUVIA 100 MG tablet, Take 100 mg by mouth daily. , Disp: , Rfl: 1 .  levETIRAcetam (KEPPRA) 500 MG tablet, Take 500 mg by mouth 2 (two) times daily. , Disp: , Rfl:  .  metoprolol succinate (TOPROL-XL) 25 MG 24 hr tablet, TAKE 1 TABLET BY MOUTH EVERY DAY, Disp: , Rfl:  .  omeprazole (PRILOSEC) 20 MG capsule, TAKE 1 CAPSULE(20 MG) BY MOUTH EVERY DAY, Disp: , Rfl:  .  oxybutynin (DITROPAN) 5 MG tablet, Take 5 mg by mouth 3 (three) times daily. , Disp: , Rfl: 5 .  simvastatin (ZOCOR) 20 MG tablet, TAKE 1 TABLET BY MOUTH NIGHTLY., Disp: , Rfl:  .  colchicine 0.6 MG tablet, Take 0.6 mg by mouth daily as needed., Disp: , Rfl:  .  ferrous sulfate 325 (65 FE) MG EC tablet, Take 325 mg by mouth daily with breakfast. , Disp: , Rfl:  .  glipiZIDE (GLUCOTROL) 5 MG tablet, TK 1 T PO WITH DINNER, Disp: , Rfl: 5 .  meloxicam (MOBIC) 7.5 MG tablet, Take 7.5 mg by mouth daily. , Disp: , Rfl: 5 .  traMADol (ULTRAM)  50 MG tablet, 1/2-1 po bid prn, Disp: , Rfl:   Physical exam:  Vitals:   10/03/17 1042  BP: (!) 157/72  Pulse: 74  Resp: 18  Temp: (!) 96.2 F (35.7 C)  TempSrc: Tympanic  Weight: 148 lb (67.1 kg)   Physical Exam  Constitutional: She is oriented to person, place, and time. She appears well-developed and well-nourished.  HENT:  Head: Normocephalic and atraumatic.  Eyes: Pupils are equal, round, and reactive to light. EOM are normal.  Neck: Normal range of motion.  Cardiovascular: Normal rate, regular rhythm and normal heart sounds.  Pulmonary/Chest: Effort normal and breath sounds normal.  Abdominal: Soft. Bowel sounds are normal.  No palpable splenomegaly  Lymphadenopathy:  No palpable cervical, supraclavicular, axillary or inguinal adenopathy   Neurological: She is alert and oriented to person, place, and time.  Skin: Skin is warm and dry.     CMP Latest Ref Rng & Units 01/24/2012  Glucose 65 - 99 mg/dL 129(H)  BUN 7 - 18 mg/dL 10  Creatinine 0.60 - 1.30 mg/dL 0.85  Sodium 136 - 145 mmol/L 139  Potassium 3.5 - 5.1 mmol/L 4.1  Chloride 98 - 107 mmol/L 104  CO2 21 - 32 mmol/L 27  Calcium 8.5 - 10.1 mg/dL 9.0  Total Protein 6.4 - 8.2 g/dL 7.6  Total Bilirubin 0.2 - 1.0 mg/dL 0.8  Alkaline Phos 50 - 136 Unit/L 109  AST 15 - 37 Unit/L 35  ALT 12 - 78 U/L 29   CBC Latest Ref Rng & Units 08/18/2016  WBC 3.6 - 11.0 K/uL 12.5(H)  Hemoglobin 12.0 - 16.0 g/dL 12.0  Hematocrit 35.0 - 47.0 % 35.8  Platelets 150 - 440 K/uL 338     Assessment and plan- Patient is a 67 y.o. female with chronic leucocytosis/ lymphocytosis  Patient has had chronic leucocytosis/ lymphocytosis dating back to 2015. Her wbc waxes and wanes between 11-14. Cbc checked [facility] last month showed wbc of 11.5. Lymphocyte count ranging between 3-5. Flow cytometry does not reveal any monoclonal B cell population. Bcr abl testign also negative. This is likely reactive. She does not need hematology follow up at  this time. She can continue to f/u with Dr. Ginette Pitman and get his cbc checked Q6 months- 1 year. If wbc is consistently trending up, she can be referred to [place] in the future   Visit Diagnosis 1. Lymphocytosis      Dr. Randa Evens, MD, MPH [facility] [phone] 10/03/2017 11:38 AM

## 2017-10-03 NOTE — Progress Notes (Signed)
Here for follow up. Overall state she is "doing fine- I feel fine "

## 2017-10-10 ENCOUNTER — Ambulatory Visit: Payer: Medicare Other | Admitting: Oncology

## 2018-01-24 DIAGNOSIS — E1165 Type 2 diabetes mellitus with hyperglycemia: Secondary | ICD-10-CM | POA: Insufficient documentation

## 2018-04-25 DIAGNOSIS — H40003 Preglaucoma, unspecified, bilateral: Secondary | ICD-10-CM | POA: Diagnosis not present

## 2018-05-24 DIAGNOSIS — G40109 Localization-related (focal) (partial) symptomatic epilepsy and epileptic syndromes with simple partial seizures, not intractable, without status epilepticus: Secondary | ICD-10-CM | POA: Diagnosis not present

## 2018-05-24 DIAGNOSIS — Z0000 Encounter for general adult medical examination without abnormal findings: Secondary | ICD-10-CM | POA: Diagnosis not present

## 2018-05-24 DIAGNOSIS — R102 Pelvic and perineal pain: Secondary | ICD-10-CM | POA: Diagnosis not present

## 2018-05-24 DIAGNOSIS — I10 Essential (primary) hypertension: Secondary | ICD-10-CM | POA: Diagnosis not present

## 2018-05-24 DIAGNOSIS — M5136 Other intervertebral disc degeneration, lumbar region: Secondary | ICD-10-CM | POA: Diagnosis not present

## 2018-05-24 DIAGNOSIS — E1165 Type 2 diabetes mellitus with hyperglycemia: Secondary | ICD-10-CM | POA: Diagnosis not present

## 2018-05-31 DIAGNOSIS — Z0000 Encounter for general adult medical examination without abnormal findings: Secondary | ICD-10-CM | POA: Diagnosis not present

## 2018-05-31 DIAGNOSIS — M5136 Other intervertebral disc degeneration, lumbar region: Secondary | ICD-10-CM | POA: Diagnosis not present

## 2018-05-31 DIAGNOSIS — G40109 Localization-related (focal) (partial) symptomatic epilepsy and epileptic syndromes with simple partial seizures, not intractable, without status epilepticus: Secondary | ICD-10-CM | POA: Diagnosis not present

## 2018-05-31 DIAGNOSIS — I10 Essential (primary) hypertension: Secondary | ICD-10-CM | POA: Diagnosis not present

## 2018-05-31 DIAGNOSIS — M5416 Radiculopathy, lumbar region: Secondary | ICD-10-CM | POA: Diagnosis not present

## 2018-05-31 DIAGNOSIS — F419 Anxiety disorder, unspecified: Secondary | ICD-10-CM | POA: Diagnosis not present

## 2018-05-31 DIAGNOSIS — D649 Anemia, unspecified: Secondary | ICD-10-CM | POA: Diagnosis not present

## 2018-05-31 DIAGNOSIS — E1165 Type 2 diabetes mellitus with hyperglycemia: Secondary | ICD-10-CM | POA: Diagnosis not present

## 2018-10-18 DIAGNOSIS — I10 Essential (primary) hypertension: Secondary | ICD-10-CM | POA: Diagnosis not present

## 2018-10-18 DIAGNOSIS — Z0000 Encounter for general adult medical examination without abnormal findings: Secondary | ICD-10-CM | POA: Diagnosis not present

## 2018-10-18 DIAGNOSIS — F419 Anxiety disorder, unspecified: Secondary | ICD-10-CM | POA: Diagnosis not present

## 2018-10-18 DIAGNOSIS — G40109 Localization-related (focal) (partial) symptomatic epilepsy and epileptic syndromes with simple partial seizures, not intractable, without status epilepticus: Secondary | ICD-10-CM | POA: Diagnosis not present

## 2018-10-18 DIAGNOSIS — E1165 Type 2 diabetes mellitus with hyperglycemia: Secondary | ICD-10-CM | POA: Diagnosis not present

## 2018-10-18 DIAGNOSIS — M5136 Other intervertebral disc degeneration, lumbar region: Secondary | ICD-10-CM | POA: Diagnosis not present

## 2018-10-18 DIAGNOSIS — M5416 Radiculopathy, lumbar region: Secondary | ICD-10-CM | POA: Diagnosis not present

## 2018-10-25 DIAGNOSIS — N3941 Urge incontinence: Secondary | ICD-10-CM | POA: Insufficient documentation

## 2018-10-25 DIAGNOSIS — D509 Iron deficiency anemia, unspecified: Secondary | ICD-10-CM | POA: Diagnosis not present

## 2018-10-25 DIAGNOSIS — L84 Corns and callosities: Secondary | ICD-10-CM | POA: Diagnosis not present

## 2018-10-25 DIAGNOSIS — E1165 Type 2 diabetes mellitus with hyperglycemia: Secondary | ICD-10-CM | POA: Diagnosis not present

## 2018-10-25 DIAGNOSIS — E119 Type 2 diabetes mellitus without complications: Secondary | ICD-10-CM | POA: Diagnosis not present

## 2018-10-25 DIAGNOSIS — M5136 Other intervertebral disc degeneration, lumbar region: Secondary | ICD-10-CM | POA: Diagnosis not present

## 2018-10-25 DIAGNOSIS — F419 Anxiety disorder, unspecified: Secondary | ICD-10-CM | POA: Diagnosis not present

## 2018-10-25 DIAGNOSIS — I10 Essential (primary) hypertension: Secondary | ICD-10-CM | POA: Diagnosis not present

## 2018-12-13 DIAGNOSIS — N3281 Overactive bladder: Secondary | ICD-10-CM | POA: Diagnosis not present

## 2018-12-13 DIAGNOSIS — N952 Postmenopausal atrophic vaginitis: Secondary | ICD-10-CM | POA: Diagnosis not present

## 2018-12-13 DIAGNOSIS — R102 Pelvic and perineal pain: Secondary | ICD-10-CM | POA: Diagnosis not present

## 2019-01-15 DIAGNOSIS — E119 Type 2 diabetes mellitus without complications: Secondary | ICD-10-CM | POA: Diagnosis not present

## 2019-02-14 DIAGNOSIS — E1165 Type 2 diabetes mellitus with hyperglycemia: Secondary | ICD-10-CM | POA: Diagnosis not present

## 2019-02-14 DIAGNOSIS — M5136 Other intervertebral disc degeneration, lumbar region: Secondary | ICD-10-CM | POA: Diagnosis not present

## 2019-02-14 DIAGNOSIS — I10 Essential (primary) hypertension: Secondary | ICD-10-CM | POA: Diagnosis not present

## 2019-02-14 DIAGNOSIS — F419 Anxiety disorder, unspecified: Secondary | ICD-10-CM | POA: Diagnosis not present

## 2019-02-21 DIAGNOSIS — G40109 Localization-related (focal) (partial) symptomatic epilepsy and epileptic syndromes with simple partial seizures, not intractable, without status epilepticus: Secondary | ICD-10-CM | POA: Diagnosis not present

## 2019-02-21 DIAGNOSIS — E785 Hyperlipidemia, unspecified: Secondary | ICD-10-CM | POA: Diagnosis not present

## 2019-02-21 DIAGNOSIS — Z0289 Encounter for other administrative examinations: Secondary | ICD-10-CM | POA: Insufficient documentation

## 2019-02-21 DIAGNOSIS — M25551 Pain in right hip: Secondary | ICD-10-CM | POA: Diagnosis not present

## 2019-02-21 DIAGNOSIS — E119 Type 2 diabetes mellitus without complications: Secondary | ICD-10-CM | POA: Diagnosis not present

## 2019-02-21 DIAGNOSIS — I10 Essential (primary) hypertension: Secondary | ICD-10-CM | POA: Diagnosis not present

## 2019-02-21 DIAGNOSIS — Z7984 Long term (current) use of oral hypoglycemic drugs: Secondary | ICD-10-CM | POA: Diagnosis not present

## 2019-02-21 DIAGNOSIS — Z23 Encounter for immunization: Secondary | ICD-10-CM | POA: Diagnosis not present

## 2019-02-21 DIAGNOSIS — F419 Anxiety disorder, unspecified: Secondary | ICD-10-CM | POA: Diagnosis not present

## 2019-02-21 DIAGNOSIS — K219 Gastro-esophageal reflux disease without esophagitis: Secondary | ICD-10-CM | POA: Diagnosis not present

## 2019-02-21 DIAGNOSIS — D649 Anemia, unspecified: Secondary | ICD-10-CM | POA: Diagnosis not present

## 2019-02-22 ENCOUNTER — Other Ambulatory Visit: Payer: Self-pay | Admitting: Internal Medicine

## 2019-02-22 DIAGNOSIS — Z1231 Encounter for screening mammogram for malignant neoplasm of breast: Secondary | ICD-10-CM

## 2019-03-28 DIAGNOSIS — Z03818 Encounter for observation for suspected exposure to other biological agents ruled out: Secondary | ICD-10-CM | POA: Diagnosis not present

## 2019-07-18 DIAGNOSIS — H40003 Preglaucoma, unspecified, bilateral: Secondary | ICD-10-CM | POA: Diagnosis not present

## 2019-07-23 DIAGNOSIS — E119 Type 2 diabetes mellitus without complications: Secondary | ICD-10-CM | POA: Diagnosis not present

## 2019-08-21 DIAGNOSIS — E119 Type 2 diabetes mellitus without complications: Secondary | ICD-10-CM | POA: Diagnosis not present

## 2019-08-21 DIAGNOSIS — H2511 Age-related nuclear cataract, right eye: Secondary | ICD-10-CM | POA: Diagnosis not present

## 2019-08-28 ENCOUNTER — Encounter: Payer: Self-pay | Admitting: Ophthalmology

## 2019-09-04 ENCOUNTER — Encounter: Payer: Self-pay | Admitting: Ophthalmology

## 2019-09-04 ENCOUNTER — Other Ambulatory Visit: Payer: Self-pay

## 2019-09-05 ENCOUNTER — Other Ambulatory Visit
Admission: RE | Admit: 2019-09-05 | Discharge: 2019-09-05 | Disposition: A | Discharge status: Home / Self Care | Payer: Medicare HMO | Source: Ambulatory Visit | Attending: Ophthalmology | Admitting: Ophthalmology

## 2019-09-05 DIAGNOSIS — Z20822 Contact with and (suspected) exposure to covid-19: Secondary | ICD-10-CM | POA: Insufficient documentation

## 2019-09-05 DIAGNOSIS — Z01812 Encounter for preprocedural laboratory examination: Secondary | ICD-10-CM | POA: Diagnosis not present

## 2019-09-05 LAB — SARS CORONAVIRUS 2 (TAT 6-24 HRS): SARS Coronavirus 2: NEGATIVE

## 2019-09-05 NOTE — Discharge Instructions (Signed)

## 2019-09-09 ENCOUNTER — Ambulatory Visit: Payer: Medicare HMO | Admitting: Anesthesiology

## 2019-09-09 ENCOUNTER — Encounter: Payer: Self-pay | Admitting: Ophthalmology

## 2019-09-09 ENCOUNTER — Encounter
Admission: RE | Disposition: A | Discharge status: Home / Self Care | Payer: Self-pay | Source: Ambulatory Visit | Attending: Ophthalmology

## 2019-09-09 ENCOUNTER — Ambulatory Visit
Admission: RE | Admit: 2019-09-09 | Discharge: 2019-09-09 | Disposition: A | Discharge status: Home / Self Care | Payer: Medicare HMO | Source: Ambulatory Visit | Attending: Ophthalmology | Admitting: Ophthalmology

## 2019-09-09 DIAGNOSIS — H2511 Age-related nuclear cataract, right eye: Secondary | ICD-10-CM | POA: Insufficient documentation

## 2019-09-09 DIAGNOSIS — F419 Anxiety disorder, unspecified: Secondary | ICD-10-CM | POA: Diagnosis not present

## 2019-09-09 DIAGNOSIS — E119 Type 2 diabetes mellitus without complications: Secondary | ICD-10-CM | POA: Insufficient documentation

## 2019-09-09 DIAGNOSIS — Z79899 Other long term (current) drug therapy: Secondary | ICD-10-CM | POA: Insufficient documentation

## 2019-09-09 DIAGNOSIS — M5136 Other intervertebral disc degeneration, lumbar region: Secondary | ICD-10-CM | POA: Insufficient documentation

## 2019-09-09 DIAGNOSIS — Z7984 Long term (current) use of oral hypoglycemic drugs: Secondary | ICD-10-CM | POA: Insufficient documentation

## 2019-09-09 DIAGNOSIS — I10 Essential (primary) hypertension: Secondary | ICD-10-CM | POA: Diagnosis not present

## 2019-09-09 DIAGNOSIS — Z7982 Long term (current) use of aspirin: Secondary | ICD-10-CM | POA: Insufficient documentation

## 2019-09-09 DIAGNOSIS — H25811 Combined forms of age-related cataract, right eye: Secondary | ICD-10-CM | POA: Diagnosis not present

## 2019-09-09 HISTORY — DX: Dizziness and giddiness: R42

## 2019-09-09 HISTORY — DX: COVID-19: U07.1

## 2019-09-09 HISTORY — DX: Anemia, unspecified: D64.9

## 2019-09-09 HISTORY — DX: Dorsopathy, unspecified: M53.9

## 2019-09-09 HISTORY — DX: Pure hypercholesterolemia, unspecified: E78.00

## 2019-09-09 HISTORY — PX: CATARACT EXTRACTION W/PHACO: SHX586

## 2019-09-09 LAB — GLUCOSE, CAPILLARY
Glucose-Capillary: 209 mg/dL — ABNORMAL HIGH (ref 70–99)
Glucose-Capillary: 193 mg/dL — ABNORMAL HIGH (ref 70–99)

## 2019-09-09 SURGERY — PHACOEMULSIFICATION, CATARACT, WITH IOL INSERTION
Anesthesia: Monitor Anesthesia Care | Site: Eye | Laterality: Right

## 2019-09-09 MED ORDER — SODIUM HYALURONATE 10 MG/ML IO SOLN
INTRAOCULAR | Status: DC | PRN
  Administered 2019-09-09: 0.55 mL via INTRAOCULAR

## 2019-09-09 MED ORDER — LACTATED RINGERS IV SOLN: 10.0000 mL/h | INTRAVENOUS | Status: DC

## 2019-09-09 MED ORDER — EPINEPHRINE PF 1 MG/ML IJ SOLN
INTRAOCULAR | Status: DC | PRN
  Administered 2019-09-09: 66 mL via OPHTHALMIC

## 2019-09-09 MED ORDER — ACETAMINOPHEN 160 MG/5ML PO SOLN: 325.0000 mg | Freq: Once | ORAL | Status: DC

## 2019-09-09 MED ORDER — TETRACAINE HCL 0.5 % OP SOLN
1.0000 [drp] | OPHTHALMIC | Status: DC | PRN
  Administered 2019-09-09 (×3): 1 [drp] via OPHTHALMIC

## 2019-09-09 MED ORDER — MIDAZOLAM HCL 2 MG/2ML IJ SOLN
INTRAMUSCULAR | Status: DC | PRN
  Administered 2019-09-09 (×2): 1 mg via INTRAVENOUS

## 2019-09-09 MED ORDER — MOXIFLOXACIN HCL 0.5 % OP SOLN
OPHTHALMIC | Status: DC | PRN
  Administered 2019-09-09: 0.2 mL via OPHTHALMIC

## 2019-09-09 MED ORDER — ARMC OPHTHALMIC DILATING DROPS
1.0000 "application " | OPHTHALMIC | Status: DC | PRN
  Administered 2019-09-09 (×3): 1 via OPHTHALMIC

## 2019-09-09 MED ORDER — LIDOCAINE HCL (PF) 2 % IJ SOLN
INTRAOCULAR | Status: DC | PRN
  Administered 2019-09-09: 1 mL via INTRAOCULAR

## 2019-09-09 MED ORDER — FENTANYL CITRATE (PF) 100 MCG/2ML IJ SOLN
INTRAMUSCULAR | Status: DC | PRN
  Administered 2019-09-09 (×2): 50 ug via INTRAVENOUS

## 2019-09-09 MED ORDER — ACETAMINOPHEN 325 MG PO TABS: 325.0000 mg | ORAL_TABLET | Freq: Once | ORAL | Status: DC

## 2019-09-09 MED ORDER — SODIUM HYALURONATE 23 MG/ML IO SOLN
INTRAOCULAR | Status: DC | PRN
  Administered 2019-09-09: 0.6 mL via INTRAOCULAR

## 2019-09-09 SURGICAL SUPPLY — 20 items
CANNULA ANT/CHMB 27G (MISCELLANEOUS) ×2 IMPLANT
CANNULA ANT/CHMB 27GA (MISCELLANEOUS) ×6 IMPLANT
DISSECTOR HYDRO NUCLEUS 50X22 (MISCELLANEOUS) ×3 IMPLANT
GLOVE SURG LX 7.5 STRW (GLOVE) ×2
GLOVE SURG LX STRL 7.5 STRW (GLOVE) ×1 IMPLANT
GLOVE SURG SYN 8.5  E (GLOVE) ×2
GLOVE SURG SYN 8.5 E (GLOVE) ×1 IMPLANT
GLOVE SURG SYN 8.5 PF PI (GLOVE) ×1 IMPLANT
GOWN STRL REUS W/ TWL LRG LVL3 (GOWN DISPOSABLE) ×2 IMPLANT
GOWN STRL REUS W/TWL LRG LVL3 (GOWN DISPOSABLE) ×4
LENS IOL DIOP 22.5 (Intraocular Lens) ×3 IMPLANT
LENS IOL TECNIS MONO 22.5 (Intraocular Lens) IMPLANT
MARKER SKIN DUAL TIP RULER LAB (MISCELLANEOUS) ×3 IMPLANT
PACK DR. KING ARMS (PACKS) ×3 IMPLANT
PACK EYE AFTER SURG (MISCELLANEOUS) ×3 IMPLANT
PACK OPTHALMIC (MISCELLANEOUS) ×3 IMPLANT
SYR 3ML LL SCALE MARK (SYRINGE) ×3 IMPLANT
SYR TB 1ML LUER SLIP (SYRINGE) ×3 IMPLANT
WATER STERILE IRR 250ML POUR (IV SOLUTION) ×3 IMPLANT
WIPE NON LINTING 3.25X3.25 (MISCELLANEOUS) ×3 IMPLANT

## 2019-09-09 NOTE — Anesthesia Procedure Notes (Signed)
Procedure Name: MAC Date/Time: 09/09/2019 7:40 AM Performed by: Georga Bora, CRNA Pre-anesthesia Checklist: Patient identified, Emergency Drugs available, Suction available, Patient being monitored and Timeout performed Patient Re-evaluated:Patient Re-evaluated prior to induction Oxygen Delivery Method: Nasal cannula

## 2019-09-09 NOTE — Op Note (Signed)
OPERATIVE NOTE  JUDIT AWAD [id number] 09/09/2019   PREOPERATIVE DIAGNOSIS:  Nuclear sclerotic cataract right eye.  H25.11   POSTOPERATIVE DIAGNOSIS:    Nuclear sclerotic cataract right eye.     PROCEDURE:  Phacoemusification with posterior chamber intraocular lens placement of the right eye   LENS:   Implant Name Type Inv. Item Serial No. Manufacturer Lot No. LRB No. Used Action  LENS IOL DIOP 22.5 - [id number] Intraocular Lens LENS IOL DIOP 22.5 [id number] AMO  Right 1 Implanted       Procedure(s) with comments: CATARACT EXTRACTION PHACO AND INTRAOCULAR LENS PLACEMENT (IOC) RIGHT DIABETIC 3.85  00:31.3 (Right) - Diabetic  DCB00 +22.5   ULTRASOUND TIME: 0 minutes 31 seconds.  CDE 3.85   SURGEON:  Benay Pillow, MD, MPH  ANESTHESIOLOGIST: Anesthesiologist: Ronelle Nigh, MD CRNA: Georga Bora, CRNA   ANESTHESIA:  Topical with tetracaine drops augmented with 1% preservative-free intracameral lidocaine.  ESTIMATED BLOOD LOSS: less than 1 mL.   COMPLICATIONS:  None.   DESCRIPTION OF PROCEDURE:  The patient was identified in the holding room and transported to the operating room and placed in the supine position under the operating microscope.  The right eye was identified as the operative eye and it was prepped and draped in the usual sterile ophthalmic fashion.   A 1.0 millimeter clear-corneal paracentesis was made at the 10:30 position. 0.5 ml of preservative-free 1% lidocaine with epinephrine was injected into the anterior chamber.  The anterior chamber was filled with Healon 5 viscoelastic.  A 2.4 millimeter keratome was used to make a near-clear corneal incision at the 8:00 position.  A curvilinear capsulorrhexis was made with a cystotome and capsulorrhexis forceps.  Balanced salt solution was used to hydrodissect and hydrodelineate the nucleus.   Phacoemulsification was then used in stop and chop fashion to remove the lens nucleus and epinucleus.  The remaining cortex  was then removed using the irrigation and aspiration handpiece. Healon was then placed into the capsular bag to distend it for lens placement.  A lens was then injected into the capsular bag.  The remaining viscoelastic was aspirated.   Wounds were hydrated with balanced salt solution.  The anterior chamber was inflated to a physiologic pressure with balanced salt solution.   Intracameral vigamox 0.1 mL undiluted was injected into the eye and a drop placed onto the ocular surface.  No wound leaks were noted.  The patient was taken to the recovery room in stable condition without complications of anesthesia or surgery  Benay Pillow 09/09/2019, 7:58 AM

## 2019-09-09 NOTE — H&P (Signed)

## 2019-09-09 NOTE — Anesthesia Preprocedure Evaluation (Signed)
Anesthesia Evaluation  Patient identified by MRN, date of birth, ID band Patient awake    Reviewed: Allergy & Precautions, H&P , NPO status , Patient's Chart, lab work & pertinent test results  Airway Mallampati: II  TM Distance: >3 FB Neck ROM: full    Dental no notable dental hx.    Pulmonary    Pulmonary exam normal breath sounds clear to auscultation       Cardiovascular hypertension, Normal cardiovascular exam Rhythm:regular Rate:Normal     Neuro/Psych    GI/Hepatic   Endo/Other  diabetes  Renal/GU      Musculoskeletal   Abdominal   Peds  Hematology   Anesthesia Other Findings   Reproductive/Obstetrics                             Anesthesia Physical Anesthesia Plan  ASA: II  Anesthesia Plan: MAC   Post-op Pain Management:    Induction:   PONV Risk Score and Plan: 2 and Treatment may vary due to age or medical condition, TIVA and Midazolam  Airway Management Planned:   Additional Equipment:   Intra-op Plan:   Post-operative Plan:   Informed Consent: I have reviewed the patients History and Physical, chart, labs and discussed the procedure including the risks, benefits and alternatives for the proposed anesthesia with the patient or authorized representative who has indicated his/her understanding and acceptance.     Dental Advisory Given  Plan Discussed with: CRNA  Anesthesia Plan Comments:         Anesthesia Quick Evaluation

## 2019-09-09 NOTE — Anesthesia Postprocedure Evaluation (Signed)
Anesthesia Post Note  Patient: Erica Castillo  Procedure(s) Performed: CATARACT EXTRACTION PHACO AND INTRAOCULAR LENS PLACEMENT (IOC) RIGHT DIABETIC 3.85  00:31.3 (Right Eye)     Patient location during evaluation: PACU Anesthesia Type: MAC Level of consciousness: awake and alert and oriented Pain management: satisfactory to patient Vital Signs Assessment: post-procedure vital signs reviewed and stable Respiratory status: spontaneous breathing, nonlabored ventilation and respiratory function stable Cardiovascular status: blood pressure returned to baseline and stable Postop Assessment: Adequate PO intake and No signs of nausea or vomiting Anesthetic complications: no    Raliegh Ip

## 2019-09-09 NOTE — Transfer of Care (Signed)
Immediate Anesthesia Transfer of Care Note  Patient: Erica Castillo  Procedure(s) Performed: CATARACT EXTRACTION PHACO AND INTRAOCULAR LENS PLACEMENT (IOC) RIGHT DIABETIC 3.85  00:31.3 (Right Eye)  Patient Location: PACU  Anesthesia Type: MAC  Level of Consciousness: awake, alert  and patient cooperative  Airway and Oxygen Therapy: Patient Spontanous Breathing and Patient connected to supplemental oxygen  Post-op Assessment: Post-op Vital signs reviewed, Patient's Cardiovascular Status Stable, Respiratory Function Stable, Patent Airway and No signs of Nausea or vomiting  Post-op Vital Signs: Reviewed and stable  Complications: No apparent anesthesia complications

## 2019-09-10 ENCOUNTER — Encounter: Payer: Self-pay | Admitting: *Deleted

## 2019-09-16 DIAGNOSIS — H2512 Age-related nuclear cataract, left eye: Secondary | ICD-10-CM | POA: Diagnosis not present

## 2019-09-16 DIAGNOSIS — I10 Essential (primary) hypertension: Secondary | ICD-10-CM | POA: Diagnosis not present

## 2019-09-23 ENCOUNTER — Encounter: Payer: Self-pay | Admitting: Ophthalmology

## 2019-09-23 ENCOUNTER — Other Ambulatory Visit: Payer: Self-pay

## 2019-09-26 ENCOUNTER — Other Ambulatory Visit: Payer: Self-pay

## 2019-09-26 ENCOUNTER — Other Ambulatory Visit
Admission: RE | Admit: 2019-09-26 | Discharge: 2019-09-26 | Disposition: A | Discharge status: Home / Self Care | Payer: Medicare HMO | Source: Ambulatory Visit | Attending: Ophthalmology | Admitting: Ophthalmology

## 2019-09-26 DIAGNOSIS — Z01812 Encounter for preprocedural laboratory examination: Secondary | ICD-10-CM | POA: Insufficient documentation

## 2019-09-26 DIAGNOSIS — Z20822 Contact with and (suspected) exposure to covid-19: Secondary | ICD-10-CM | POA: Insufficient documentation

## 2019-09-26 LAB — SARS CORONAVIRUS 2 (TAT 6-24 HRS): SARS Coronavirus 2: NEGATIVE

## 2019-09-26 NOTE — Discharge Instructions (Signed)

## 2019-09-30 ENCOUNTER — Ambulatory Visit: Payer: Medicare HMO | Admitting: Anesthesiology

## 2019-09-30 ENCOUNTER — Encounter
Admission: RE | Disposition: A | Discharge status: Home / Self Care | Payer: Self-pay | Source: Home / Self Care | Attending: Ophthalmology

## 2019-09-30 ENCOUNTER — Other Ambulatory Visit: Payer: Self-pay

## 2019-09-30 ENCOUNTER — Ambulatory Visit
Admission: RE | Admit: 2019-09-30 | Discharge: 2019-09-30 | Disposition: A | Discharge status: Home / Self Care | Payer: Medicare HMO | Attending: Ophthalmology | Admitting: Ophthalmology

## 2019-09-30 ENCOUNTER — Encounter: Payer: Self-pay | Admitting: Ophthalmology

## 2019-09-30 DIAGNOSIS — H2512 Age-related nuclear cataract, left eye: Secondary | ICD-10-CM | POA: Diagnosis not present

## 2019-09-30 DIAGNOSIS — H25812 Combined forms of age-related cataract, left eye: Secondary | ICD-10-CM | POA: Diagnosis not present

## 2019-09-30 DIAGNOSIS — Z7982 Long term (current) use of aspirin: Secondary | ICD-10-CM | POA: Diagnosis not present

## 2019-09-30 DIAGNOSIS — K219 Gastro-esophageal reflux disease without esophagitis: Secondary | ICD-10-CM | POA: Insufficient documentation

## 2019-09-30 DIAGNOSIS — Z79899 Other long term (current) drug therapy: Secondary | ICD-10-CM | POA: Diagnosis not present

## 2019-09-30 DIAGNOSIS — Z7984 Long term (current) use of oral hypoglycemic drugs: Secondary | ICD-10-CM | POA: Diagnosis not present

## 2019-09-30 DIAGNOSIS — E1136 Type 2 diabetes mellitus with diabetic cataract: Secondary | ICD-10-CM | POA: Insufficient documentation

## 2019-09-30 DIAGNOSIS — I10 Essential (primary) hypertension: Secondary | ICD-10-CM | POA: Diagnosis not present

## 2019-09-30 HISTORY — PX: CATARACT EXTRACTION W/PHACO: SHX586

## 2019-09-30 LAB — GLUCOSE, CAPILLARY
Glucose-Capillary: 161 mg/dL — ABNORMAL HIGH (ref 70–99)
Glucose-Capillary: 163 mg/dL — ABNORMAL HIGH (ref 70–99)

## 2019-09-30 SURGERY — PHACOEMULSIFICATION, CATARACT, WITH IOL INSERTION
Anesthesia: Monitor Anesthesia Care | Site: Eye | Laterality: Left

## 2019-09-30 MED ORDER — EPINEPHRINE PF 1 MG/ML IJ SOLN
INTRAOCULAR | Status: DC | PRN
  Administered 2019-09-30: 78 mL via OPHTHALMIC

## 2019-09-30 MED ORDER — LIDOCAINE HCL (PF) 2 % IJ SOLN
INTRAOCULAR | Status: DC | PRN
  Administered 2019-09-30: 1 mL via INTRAOCULAR

## 2019-09-30 MED ORDER — FENTANYL CITRATE (PF) 100 MCG/2ML IJ SOLN
INTRAMUSCULAR | Status: DC | PRN
  Administered 2019-09-30: 100 ug via INTRAVENOUS

## 2019-09-30 MED ORDER — TETRACAINE HCL 0.5 % OP SOLN
1.0000 [drp] | OPHTHALMIC | Status: DC | PRN
  Administered 2019-09-30 (×3): 1 [drp] via OPHTHALMIC

## 2019-09-30 MED ORDER — MOXIFLOXACIN HCL 0.5 % OP SOLN
OPHTHALMIC | Status: DC | PRN
  Administered 2019-09-30: 0.2 mL via OPHTHALMIC

## 2019-09-30 MED ORDER — MIDAZOLAM HCL 2 MG/2ML IJ SOLN
INTRAMUSCULAR | Status: DC | PRN
  Administered 2019-09-30: 2 mg via INTRAVENOUS

## 2019-09-30 MED ORDER — SODIUM HYALURONATE 10 MG/ML IO SOLN
INTRAOCULAR | Status: DC | PRN
  Administered 2019-09-30: 0.55 mL via INTRAOCULAR

## 2019-09-30 MED ORDER — ARMC OPHTHALMIC DILATING DROPS
1.0000 "application " | OPHTHALMIC | Status: DC | PRN
  Administered 2019-09-30 (×3): 1 via OPHTHALMIC

## 2019-09-30 MED ORDER — LACTATED RINGERS IV SOLN: INTRAVENOUS | Status: DC

## 2019-09-30 MED ORDER — SODIUM HYALURONATE 23 MG/ML IO SOLN
INTRAOCULAR | Status: DC | PRN
  Administered 2019-09-30: 0.6 mL via INTRAOCULAR

## 2019-09-30 SURGICAL SUPPLY — 20 items
CANNULA ANT/CHMB 27G (MISCELLANEOUS) ×2 IMPLANT
CANNULA ANT/CHMB 27GA (MISCELLANEOUS) ×6 IMPLANT
DISSECTOR HYDRO NUCLEUS 50X22 (MISCELLANEOUS) ×3 IMPLANT
GLOVE SURG LX 7.5 STRW (GLOVE) ×2
GLOVE SURG LX STRL 7.5 STRW (GLOVE) ×1 IMPLANT
GLOVE SURG SYN 8.5  E (GLOVE) ×2
GLOVE SURG SYN 8.5 E (GLOVE) ×1 IMPLANT
GLOVE SURG SYN 8.5 PF PI (GLOVE) ×1 IMPLANT
GOWN STRL REUS W/ TWL LRG LVL3 (GOWN DISPOSABLE) ×2 IMPLANT
GOWN STRL REUS W/TWL LRG LVL3 (GOWN DISPOSABLE) ×4
LENS IOL DIOP 23.0 (Intraocular Lens) ×3 IMPLANT
LENS IOL TECNIS MONO 23.0 (Intraocular Lens) IMPLANT
MARKER SKIN DUAL TIP RULER LAB (MISCELLANEOUS) ×3 IMPLANT
PACK DR. KING ARMS (PACKS) ×3 IMPLANT
PACK EYE AFTER SURG (MISCELLANEOUS) ×3 IMPLANT
PACK OPTHALMIC (MISCELLANEOUS) ×3 IMPLANT
SYR 3ML LL SCALE MARK (SYRINGE) ×3 IMPLANT
SYR TB 1ML LUER SLIP (SYRINGE) ×3 IMPLANT
WATER STERILE IRR 250ML POUR (IV SOLUTION) ×3 IMPLANT
WIPE NON LINTING 3.25X3.25 (MISCELLANEOUS) ×3 IMPLANT

## 2019-09-30 NOTE — Anesthesia Preprocedure Evaluation (Addendum)
Anesthesia Evaluation  Patient identified by MRN, date of birth, ID band Patient awake    History of Anesthesia Complications Negative for: history of anesthetic complications  Airway Mallampati: I  TM Distance: >3 FB Neck ROM: Full    Dental no notable dental hx.    Pulmonary neg pulmonary ROS,    Pulmonary exam normal        Cardiovascular Exercise Tolerance: Good hypertension, Pt. on medications Normal cardiovascular exam     Neuro/Psych Seizures - (hx absence seizures, last one >5 years ago),     GI/Hepatic GERD  ,  Endo/Other  diabetes  Renal/GU      Musculoskeletal   Abdominal   Peds  Hematology   Anesthesia Other Findings   Reproductive/Obstetrics                            Anesthesia Physical Anesthesia Plan  ASA: II  Anesthesia Plan: MAC   Post-op Pain Management:    Induction: Intravenous  PONV Risk Score and Plan: 2 and TIVA, Midazolam and Treatment may vary due to age or medical condition  Airway Management Planned: Nasal Cannula and Natural Airway  Additional Equipment: None  Intra-op Plan:   Post-operative Plan:   Informed Consent: I have reviewed the patients History and Physical, chart, labs and discussed the procedure including the risks, benefits and alternatives for the proposed anesthesia with the patient or authorized representative who has indicated his/her understanding and acceptance.       Plan Discussed with: CRNA  Anesthesia Plan Comments:         Anesthesia Quick Evaluation

## 2019-09-30 NOTE — H&P (Signed)

## 2019-09-30 NOTE — Anesthesia Postprocedure Evaluation (Signed)
Anesthesia Post Note  Patient: Erica Castillo  Procedure(s) Performed: CATARACT EXTRACTION PHACO AND INTRAOCULAR LENS PLACEMENT (IOC) LEFT DIABETIC 2.69  00:28.7 (Left Eye)     Patient location during evaluation: PACU Anesthesia Type: MAC Level of consciousness: awake and alert Pain management: pain level controlled Vital Signs Assessment: post-procedure vital signs reviewed and stable Respiratory status: spontaneous breathing Cardiovascular status: blood pressure returned to baseline Postop Assessment: no apparent nausea or vomiting, adequate PO intake and no headache Anesthetic complications: no   No complications documented.  Adele Barthel Ernie Kasler

## 2019-09-30 NOTE — Anesthesia Procedure Notes (Signed)
Procedure Name: MAC Date/Time: 09/30/2019 10:04 AM Performed by: Silvana Newness, CRNA Pre-anesthesia Checklist: Patient identified, Emergency Drugs available, Suction available, Patient being monitored and Timeout performed Patient Re-evaluated:Patient Re-evaluated prior to induction Oxygen Delivery Method: Nasal cannula

## 2019-09-30 NOTE — Op Note (Signed)
OPERATIVE NOTE  SVEA PUSCH [id number] 09/30/2019   PREOPERATIVE DIAGNOSIS:  Nuclear sclerotic cataract left eye.  H25.12   POSTOPERATIVE DIAGNOSIS:    Nuclear sclerotic cataract left eye.     PROCEDURE:  Phacoemusification with posterior chamber intraocular lens placement of the left eye   LENS:   Implant Name Type Inv. Item Serial No. Manufacturer Lot No. LRB No. Used Action  LENS IOL DIOP 23.0 - [id number] Intraocular Lens LENS IOL DIOP 23.0 [id number] AMO [employer]  Left 1 Implanted      Procedure(s) with comments: CATARACT EXTRACTION PHACO AND INTRAOCULAR LENS PLACEMENT (IOC) LEFT DIABETIC 2.69  00:28.7 (Left) - Diabetic - oral meds  DCB00 +23.0   ULTRASOUND TIME: 0 minutes 28 seconds.  CDE 2.69   SURGEON:  Benay Pillow, MD, MPH   ANESTHESIA:  Topical with tetracaine drops augmented with 1% preservative-free intracameral lidocaine.  ESTIMATED BLOOD LOSS: <1 mL   COMPLICATIONS:  None.   DESCRIPTION OF PROCEDURE:  The patient was identified in the holding room and transported to the operating room and placed in the supine position under the operating microscope.  The left eye was identified as the operative eye and it was prepped and draped in the usual sterile ophthalmic fashion.   A 1.0 millimeter clear-corneal paracentesis was made at the 5:00 position. 0.5 ml of preservative-free 1% lidocaine with epinephrine was injected into the anterior chamber.  The anterior chamber was filled with Healon 5 viscoelastic.  A 2.4 millimeter keratome was used to make a near-clear corneal incision at the 2:00 position.  A curvilinear capsulorrhexis was made with a cystotome and capsulorrhexis forceps.  Balanced salt solution was used to hydrodissect and hydrodelineate the nucleus.   Phacoemulsification was then used in stop and chop fashion to remove the lens nucleus and epinucleus.  The remaining cortex was then removed using the irrigation and aspiration handpiece. Healon  was then placed into the capsular bag to distend it for lens placement.  A lens was then injected into the capsular bag.  The remaining viscoelastic was aspirated.   Wounds were hydrated with balanced salt solution.  The anterior chamber was inflated to a physiologic pressure with balanced salt solution.  Intracameral vigamox 0.1 mL undiltued was injected into the eye and a drop placed onto the ocular surface.  No wound leaks were noted.  The patient was taken to the recovery room in stable condition without complications of anesthesia or surgery  Benay Pillow 09/30/2019, 10:23 AM

## 2019-09-30 NOTE — Transfer of Care (Signed)
Immediate Anesthesia Transfer of Care Note  Patient: Erica Castillo  Procedure(s) Performed: CATARACT EXTRACTION PHACO AND INTRAOCULAR LENS PLACEMENT (IOC) LEFT DIABETIC 2.69  00:28.7 (Left Eye)  Patient Location: PACU  Anesthesia Type: MAC  Level of Consciousness: awake, alert  and patient cooperative  Airway and Oxygen Therapy: Patient Spontanous Breathing and Patient connected to supplemental oxygen  Post-op Assessment: Post-op Vital signs reviewed, Patient's Cardiovascular Status Stable, Respiratory Function Stable, Patent Airway and No signs of Nausea or vomiting  Post-op Vital Signs: Reviewed and stable  Complications: No complications documented.

## 2019-10-01 ENCOUNTER — Encounter: Payer: Self-pay | Admitting: Ophthalmology

## 2019-10-25 DIAGNOSIS — Z961 Presence of intraocular lens: Secondary | ICD-10-CM | POA: Diagnosis not present

## 2019-11-26 DIAGNOSIS — M5136 Other intervertebral disc degeneration, lumbar region: Secondary | ICD-10-CM | POA: Diagnosis not present

## 2019-11-26 DIAGNOSIS — Z1231 Encounter for screening mammogram for malignant neoplasm of breast: Secondary | ICD-10-CM | POA: Diagnosis not present

## 2019-11-26 DIAGNOSIS — E1165 Type 2 diabetes mellitus with hyperglycemia: Secondary | ICD-10-CM | POA: Diagnosis not present

## 2019-11-26 DIAGNOSIS — G40109 Localization-related (focal) (partial) symptomatic epilepsy and epileptic syndromes with simple partial seizures, not intractable, without status epilepticus: Secondary | ICD-10-CM | POA: Diagnosis not present

## 2019-11-26 DIAGNOSIS — I10 Essential (primary) hypertension: Secondary | ICD-10-CM | POA: Diagnosis not present

## 2019-11-26 DIAGNOSIS — D649 Anemia, unspecified: Secondary | ICD-10-CM | POA: Diagnosis not present

## 2019-11-26 DIAGNOSIS — Z79899 Other long term (current) drug therapy: Secondary | ICD-10-CM | POA: Diagnosis not present

## 2019-11-26 DIAGNOSIS — R5383 Other fatigue: Secondary | ICD-10-CM | POA: Diagnosis not present

## 2019-11-26 DIAGNOSIS — Z0289 Encounter for other administrative examinations: Secondary | ICD-10-CM | POA: Diagnosis not present

## 2019-12-03 DIAGNOSIS — D649 Anemia, unspecified: Secondary | ICD-10-CM | POA: Diagnosis not present

## 2019-12-03 DIAGNOSIS — I10 Essential (primary) hypertension: Secondary | ICD-10-CM | POA: Diagnosis not present

## 2019-12-03 DIAGNOSIS — Z0000 Encounter for general adult medical examination without abnormal findings: Secondary | ICD-10-CM | POA: Diagnosis not present

## 2019-12-03 DIAGNOSIS — R0989 Other specified symptoms and signs involving the circulatory and respiratory systems: Secondary | ICD-10-CM | POA: Diagnosis not present

## 2019-12-03 DIAGNOSIS — E119 Type 2 diabetes mellitus without complications: Secondary | ICD-10-CM | POA: Diagnosis not present

## 2019-12-03 DIAGNOSIS — Z79899 Other long term (current) drug therapy: Secondary | ICD-10-CM | POA: Diagnosis not present

## 2019-12-03 DIAGNOSIS — F419 Anxiety disorder, unspecified: Secondary | ICD-10-CM | POA: Diagnosis not present

## 2019-12-03 DIAGNOSIS — G40109 Localization-related (focal) (partial) symptomatic epilepsy and epileptic syndromes with simple partial seizures, not intractable, without status epilepticus: Secondary | ICD-10-CM | POA: Diagnosis not present

## 2020-01-02 DIAGNOSIS — R0989 Other specified symptoms and signs involving the circulatory and respiratory systems: Secondary | ICD-10-CM | POA: Diagnosis not present

## 2020-01-02 DIAGNOSIS — I6523 Occlusion and stenosis of bilateral carotid arteries: Secondary | ICD-10-CM | POA: Diagnosis not present

## 2020-05-06 ENCOUNTER — Other Ambulatory Visit: Payer: Self-pay | Admitting: Internal Medicine

## 2020-05-06 DIAGNOSIS — Z1231 Encounter for screening mammogram for malignant neoplasm of breast: Secondary | ICD-10-CM

## 2020-06-18 ENCOUNTER — Ambulatory Visit
Admission: RE | Admit: 2020-06-18 | Discharge: 2020-06-18 | Disposition: A | Discharge status: Home / Self Care | Payer: Medicare Other | Source: Ambulatory Visit | Attending: Internal Medicine | Admitting: Internal Medicine

## 2020-06-18 ENCOUNTER — Other Ambulatory Visit: Payer: Self-pay

## 2020-06-18 DIAGNOSIS — Z1231 Encounter for screening mammogram for malignant neoplasm of breast: Secondary | ICD-10-CM | POA: Diagnosis not present

## 2021-03-02 DIAGNOSIS — K219 Gastro-esophageal reflux disease without esophagitis: Secondary | ICD-10-CM | POA: Insufficient documentation

## 2021-04-06 ENCOUNTER — Encounter
Admission: RE | Disposition: A | Discharge status: Home / Self Care | Payer: Self-pay | Source: Ambulatory Visit | Attending: Gastroenterology

## 2021-04-06 ENCOUNTER — Ambulatory Visit: Payer: Medicare Other | Admitting: Anesthesiology

## 2021-04-06 ENCOUNTER — Other Ambulatory Visit: Payer: Self-pay

## 2021-04-06 ENCOUNTER — Ambulatory Visit
Admission: RE | Admit: 2021-04-06 | Discharge: 2021-04-06 | Disposition: A | Discharge status: Home / Self Care | Payer: Medicare Other | Source: Ambulatory Visit | Attending: Gastroenterology | Admitting: Gastroenterology

## 2021-04-06 DIAGNOSIS — E119 Type 2 diabetes mellitus without complications: Secondary | ICD-10-CM | POA: Diagnosis not present

## 2021-04-06 DIAGNOSIS — Z8616 Personal history of COVID-19: Secondary | ICD-10-CM | POA: Diagnosis not present

## 2021-04-06 DIAGNOSIS — K317 Polyp of stomach and duodenum: Secondary | ICD-10-CM | POA: Diagnosis not present

## 2021-04-06 DIAGNOSIS — D509 Iron deficiency anemia, unspecified: Secondary | ICD-10-CM | POA: Insufficient documentation

## 2021-04-06 DIAGNOSIS — K5520 Angiodysplasia of colon without hemorrhage: Secondary | ICD-10-CM | POA: Diagnosis not present

## 2021-04-06 DIAGNOSIS — K5730 Diverticulosis of large intestine without perforation or abscess without bleeding: Secondary | ICD-10-CM | POA: Diagnosis not present

## 2021-04-06 DIAGNOSIS — I10 Essential (primary) hypertension: Secondary | ICD-10-CM | POA: Insufficient documentation

## 2021-04-06 DIAGNOSIS — D123 Benign neoplasm of transverse colon: Secondary | ICD-10-CM | POA: Insufficient documentation

## 2021-04-06 DIAGNOSIS — K449 Diaphragmatic hernia without obstruction or gangrene: Secondary | ICD-10-CM | POA: Diagnosis not present

## 2021-04-06 DIAGNOSIS — M1991 Primary osteoarthritis, unspecified site: Secondary | ICD-10-CM | POA: Diagnosis not present

## 2021-04-06 DIAGNOSIS — K2970 Gastritis, unspecified, without bleeding: Secondary | ICD-10-CM | POA: Diagnosis not present

## 2021-04-06 DIAGNOSIS — E7800 Pure hypercholesterolemia, unspecified: Secondary | ICD-10-CM | POA: Insufficient documentation

## 2021-04-06 DIAGNOSIS — E785 Hyperlipidemia, unspecified: Secondary | ICD-10-CM | POA: Insufficient documentation

## 2021-04-06 HISTORY — PX: ESOPHAGOGASTRODUODENOSCOPY (EGD) WITH PROPOFOL: SHX5813

## 2021-04-06 HISTORY — PX: COLONOSCOPY WITH PROPOFOL: SHX5780

## 2021-04-06 LAB — GLUCOSE, CAPILLARY: Glucose-Capillary: 218 mg/dL — ABNORMAL HIGH (ref 70–99)

## 2021-04-06 SURGERY — COLONOSCOPY WITH PROPOFOL
Anesthesia: General

## 2021-04-06 MED ORDER — PROPOFOL 500 MG/50ML IV EMUL
INTRAVENOUS | Status: AC
  Filled 2021-04-06: qty 50

## 2021-04-06 MED ORDER — SODIUM CHLORIDE 0.9 % IV SOLN: INTRAVENOUS | Status: DC

## 2021-04-06 MED ORDER — LIDOCAINE HCL (CARDIAC) PF 100 MG/5ML IV SOSY
PREFILLED_SYRINGE | INTRAVENOUS | Status: DC | PRN
Start: 2021-04-06 — End: 2021-04-06
  Administered 2021-04-06: 50 mg via INTRAVENOUS

## 2021-04-06 MED ORDER — PHENYLEPHRINE HCL (PRESSORS) 10 MG/ML IV SOLN
INTRAVENOUS | Status: DC | PRN
  Administered 2021-04-06: 80 ug via INTRAVENOUS

## 2021-04-06 MED ORDER — PROPOFOL 500 MG/50ML IV EMUL
INTRAVENOUS | Status: DC | PRN
Start: 2021-04-06 — End: 2021-04-06
  Administered 2021-04-06: 150 ug/kg/min via INTRAVENOUS

## 2021-04-06 MED ORDER — PROPOFOL 10 MG/ML IV BOLUS
INTRAVENOUS | Status: DC | PRN
Start: 2021-04-06 — End: 2021-04-06
  Administered 2021-04-06: 70 mg via INTRAVENOUS

## 2021-04-06 NOTE — Anesthesia Procedure Notes (Signed)
Date/Time: 04/06/2021 9:31 AM Performed by: Johnna Acosta, CRNA Pre-anesthesia Checklist: Patient identified, Emergency Drugs available, Suction available, Patient being monitored and Timeout performed Patient Re-evaluated:Patient Re-evaluated prior to induction Oxygen Delivery Method: Nasal cannula Preoxygenation: Pre-oxygenation with 100% oxygen Induction Type: IV induction

## 2021-04-06 NOTE — Transfer of Care (Signed)
Immediate Anesthesia Transfer of Care Note  Patient: Erica Castillo  Procedure(s) Performed: COLONOSCOPY WITH PROPOFOL ESOPHAGOGASTRODUODENOSCOPY (EGD) WITH PROPOFOL  Patient Location: PACU  Anesthesia Type:General  Level of Consciousness: awake and drowsy  Airway & Oxygen Therapy: Patient Spontanous Breathing  Post-op Assessment: Report given to RN and Post -op Vital signs reviewed and stable  Post vital signs: Reviewed and stable  Last Vitals:  Vitals Value Taken Time  BP 145/89 04/06/21 1016  Temp    Pulse 96 04/06/21 1016  Resp 17 04/06/21 1016  SpO2 89 % 04/06/21 1016  Vitals shown include unvalidated device data.  Last Pain:  Vitals:   04/06/21 0901  TempSrc: Temporal  PainSc: 0-No pain         Complications: No notable events documented.

## 2021-04-06 NOTE — Interval H&P Note (Signed)
History and Physical Interval Note:  04/06/2021 9:22 AM  Erica Castillo [name]  has presented today for surgery, with the diagnosis of IDA.  The various methods of treatment have been discussed with the patient and family. After consideration of risks, benefits and other options for treatment, the patient has consented to  Procedure(s) with comments: COLONOSCOPY WITH PROPOFOL (N/A) - DM ESOPHAGOGASTRODUODENOSCOPY (EGD) WITH PROPOFOL (N/A) as a surgical intervention.  The patient's history has been reviewed, patient examined, no change in status, stable for surgery.  I have reviewed the patient's chart and labs.  Questions were answered to the patient's satisfaction.     Lesly Rubenstein  Ok to proceed with EGD/Colonoscopy

## 2021-04-06 NOTE — Anesthesia Postprocedure Evaluation (Signed)
Anesthesia Post Note  Patient: MELAYNA ROBARTS  Procedure(s) Performed: COLONOSCOPY WITH PROPOFOL ESOPHAGOGASTRODUODENOSCOPY (EGD) WITH PROPOFOL  Patient location during evaluation: Endoscopy Anesthesia Type: General Level of consciousness: awake and alert Pain management: pain level controlled Vital Signs Assessment: post-procedure vital signs reviewed and stable Respiratory status: spontaneous breathing, nonlabored ventilation, respiratory function stable and patient connected to nasal cannula oxygen Cardiovascular status: blood pressure returned to baseline and stable Postop Assessment: no apparent nausea or vomiting Anesthetic complications: no   No notable events documented.   Last Vitals:  Vitals:   04/06/21 1025 04/06/21 1035  BP: (!) 192/96 (!) 180/95  Pulse:    Resp:    Temp:    SpO2:  100%    Last Pain:  Vitals:   04/06/21 1035  TempSrc:   PainSc: 0-No pain                 Precious Haws Demetres Prochnow

## 2021-04-06 NOTE — H&P (Signed)
Outpatient short stay form Pre-procedure 04/06/2021  Lesly Rubenstein, MD  Primary Physician: Tracie Harrier, MD  Reason for visit:  IDA  History of present illness:   71 y/o lady with history of DM II, hypertension, and HLD here for IDA. States she had a colonoscopy over 10 years ago. Never had an EGD. No family history of GI malignancies. No blood thinners. History of cholecystectomy and hysterectomy.    Current Facility-Administered Medications:    0.9 %  sodium chloride infusion, , Intravenous, Continuous, Simranjit Thayer, Hilton Cork, MD, Last Rate: 20 mL/hr at 04/06/21 0919, New Bag at 04/06/21 0919  Medications Prior to Admission  Medication Sig Dispense Refill Last Dose   amLODipine (NORVASC) 5 MG tablet Take 5 mg by mouth daily.   Past Week   aspirin EC 81 MG tablet Take 81 mg by mouth daily.   Past Week   benazepril (LOTENSIN) 40 MG tablet Take 40 mg by mouth daily.   Past Week   dapagliflozin propanediol (FARXIGA) 5 MG TABS tablet Take 1 tablet by mouth every morning   Past Week   Dulaglutide (TRULICITY) 1.60 VP/7.1GG SOPN Inject into the skin.   Past Week   glipiZIDE (GLUCOTROL) 5 MG tablet 5 mg 2 (two) times daily before a meal.   5 Past Week   levETIRAcetam (KEPPRA) 500 MG tablet Take 500 mg by mouth 2 (two) times daily.    04/06/2021 at 0600   meloxicam (MOBIC) 7.5 MG tablet Take 7.5 mg by mouth daily.   5 Past Week   metoprolol succinate (TOPROL-XL) 25 MG 24 hr tablet 50 mg daily.    04/06/2021 at 0600   omeprazole (PRILOSEC) 20 MG capsule TAKE 1 CAPSULE(20 MG) BY MOUTH EVERY DAY   Past Week   simvastatin (ZOCOR) 20 MG tablet TAKE 1 TABLET BY MOUTH NIGHTLY.   Past Week   acetaminophen (TYLENOL) 500 MG tablet Take 500 mg by mouth every 6 (six) hours as needed.      CINNAMON PO Take by mouth daily.      colchicine 0.6 MG tablet Take 0.6 mg by mouth daily as needed.      oxybutynin (DITROPAN) 5 MG tablet Take 5 mg by mouth 2 (two) times daily.   5    traMADol (ULTRAM) 50 MG tablet  1/2-1 po bid prn        No Known Allergies   Past Medical History:  Diagnosis Date   Anemia    HX of   Arthritis    Back problem    COVID-19    12/20   Diabetes mellitus without complication ([place])    Gout    History of seizures    under control x 10 years   Hypercholesterolemia    Hypertension    Indigestion    Vertigo    in past    Review of systems:  Otherwise negative.    Physical Exam  Gen: Alert, oriented. Appears stated age.  HEENT: PERRLA. Lungs: No respiratory distress CV: RRR Abd: soft, benign, no masses Ext: No edema    Planned procedures: Proceed with EGD/colonoscopy. The patient understands the nature of the planned procedure, indications, risks, alternatives and potential complications including but not limited to bleeding, infection, perforation, damage to internal organs and possible oversedation/side effects from anesthesia. The patient agrees and gives consent to proceed.  Please refer to procedure notes for findings, recommendations and patient disposition/instructions.     Lesly Rubenstein, MD [facility]

## 2021-04-06 NOTE — Anesthesia Preprocedure Evaluation (Signed)
Anesthesia Evaluation  Patient identified by MRN, date of birth, ID band Patient awake    Reviewed: Allergy & Precautions, NPO status , Patient's Chart, lab work & pertinent test results  History of Anesthesia Complications Negative for: history of anesthetic complications  Airway Mallampati: III  TM Distance: >3 FB Neck ROM: full    Dental  (+) Chipped   Pulmonary neg pulmonary ROS, neg shortness of breath,    Pulmonary exam normal        Cardiovascular Exercise Tolerance: Good hypertension, (-) angina(-) Past MI Normal cardiovascular exam     Neuro/Psych PSYCHIATRIC DISORDERS negative neurological ROS     GI/Hepatic negative GI ROS, Neg liver ROS,   Endo/Other  diabetes, Type 2  Renal/GU negative Renal ROS  negative genitourinary   Musculoskeletal  (+) Arthritis ,   Abdominal   Peds  Hematology negative hematology ROS (+)   Anesthesia Other Findings Past Medical History: No date: Anemia     Comment:  HX of No date: Arthritis No date: Back problem No date: COVID-19     Comment:  12/20 No date: Diabetes mellitus without complication ([facility]) No date: Gout No date: History of seizures     Comment:  under control x 10 years No date: Hypercholesterolemia No date: Hypertension No date: Indigestion No date: Vertigo     Comment:  in past  Past Surgical History: No date: ABDOMINAL HYSTERECTOMY 09/09/2019: CATARACT EXTRACTION W/PHACO; Right     Comment:  Procedure: CATARACT EXTRACTION PHACO AND INTRAOCULAR               LENS PLACEMENT (IOC) RIGHT DIABETIC 3.85  00:31.3;                Surgeon: Eulogio Bear, MD;  Location: [facility];  Service: Ophthalmology;  Laterality:               Right;  Diabetic 09/30/2019: CATARACT EXTRACTION W/PHACO; Left     Comment:  Procedure: CATARACT EXTRACTION PHACO AND INTRAOCULAR               LENS PLACEMENT (IOC) LEFT DIABETIC 2.69  00:28.7;                 Surgeon: Eulogio Bear, MD;  Location: [facility];  Service: Ophthalmology;  Laterality: Left;              Diabetic - oral meds No date: CHOLECYSTECTOMY No date: COLONOSCOPY No date: INCONTINENCE SURGERY     Comment:  bladder suspension  BMI    Body Mass Index: 25.79 kg/m      Reproductive/Obstetrics negative OB ROS                             Anesthesia Physical Anesthesia Plan  ASA: 3  Anesthesia Plan: General   Post-op Pain Management:    Induction: Intravenous  PONV Risk Score and Plan: Propofol infusion and TIVA  Airway Management Planned: Natural Airway and Nasal Cannula  Additional Equipment:   Intra-op Plan:   Post-operative Plan:   Informed Consent: I have reviewed the patients History and Physical, chart, labs and discussed the procedure including the risks, benefits and alternatives for the proposed anesthesia with the patient or authorized representative who has indicated his/her understanding and  acceptance.     Dental Advisory Given  Plan Discussed with: Anesthesiologist, CRNA and Surgeon  Anesthesia Plan Comments: (Patient consented for risks of anesthesia including but not limited to:  - adverse reactions to medications - risk of airway placement if required - damage to eyes, teeth, lips or other oral mucosa - nerve damage due to positioning  - sore throat or hoarseness - Damage to heart, brain, nerves, lungs, other parts of body or loss of life  Patient voiced understanding.)        Anesthesia Quick Evaluation

## 2021-04-06 NOTE — Op Note (Signed)
[facility] Patient Name: Erica Castillo Procedure Date: 04/06/2021 9:13 AM MRN: [id number] Account #: [id number] Date of Birth: [birth year] Admit Type: Outpatient Age: 71 Room: [facility] ENDO ROOM 1 Gender: Female Note Status: Finalized Instrument Name: Park Meo [id number] Procedure:             Colonoscopy Indications:           Iron deficiency anemia Providers:             Andrey Farmer MD, MD Medicines:             Monitored Anesthesia Care Complications:         No immediate complications. Estimated blood loss:                         Minimal. Procedure:             Pre-Anesthesia Assessment:                        - Prior to the procedure, a History and Physical was                         performed, and patient medications and allergies were                         reviewed. The patient is competent. The risks and                         benefits of the procedure and the sedation options and                         risks were discussed with the patient. All questions                         were answered and informed consent was obtained.                         Patient identification and proposed procedure were                         verified by the physician, the nurse, the                         anesthesiologist, the anesthetist and the technician                         in the endoscopy suite. Mental Status Examination:                         alert and oriented. Airway Examination: normal                         oropharyngeal airway and neck mobility. Respiratory                         Examination: clear to auscultation. CV Examination:                         normal. Prophylactic Antibiotics: The patient does not  require prophylactic antibiotics. Prior                         Anticoagulants: The patient has taken no previous                         anticoagulant or antiplatelet agents. ASA Grade                          Assessment: II - A patient with mild systemic disease.                         After reviewing the risks and benefits, the patient                         was deemed in satisfactory condition to undergo the                         procedure. The anesthesia plan was to use monitored                         anesthesia care (MAC). Immediately prior to                         administration of medications, the patient was                         re-assessed for adequacy to receive sedatives. The                         heart rate, respiratory rate, oxygen saturations,                         blood pressure, adequacy of pulmonary ventilation, and                         response to care were monitored throughout the                         procedure. The physical status of the patient was                         re-assessed after the procedure.                        After obtaining informed consent, the colonoscope was                         passed under direct vision. Throughout the procedure,                         the patient's blood pressure, pulse, and oxygen                         saturations were monitored continuously. The                         Colonoscope was introduced through the anus and  advanced to the the cecum, identified by appendiceal                         orifice and ileocecal valve. The colonoscopy was                         performed without difficulty. The patient tolerated                         the procedure well. The quality of the bowel                         preparation was adequate to identify polyps. Findings:      The perianal and digital rectal examinations were normal.      Multiple small patchy angiodysplastic lesions without bleeding were       found in the cecum.      Two sessile polyps were found in the proximal transverse colon. The       polyps were 2 to 3 mm in size. These polyps were removed with a cold       snare.  Resection and retrieval were complete. Estimated blood loss was       minimal.      Multiple small-mouthed diverticula were found in the sigmoid colon.      The exam was otherwise without abnormality on direct and retroflexion       views. Impression:            - Multiple non-bleeding colonic angiodysplastic                         lesions.                        - Two 2 to 3 mm polyps in the proximal transverse                         colon, removed with a cold snare. Resected and                         retrieved.                        - Diverticulosis in the sigmoid colon.                        - The examination was otherwise normal on direct and                         retroflexion views. Multiple attempts to intubate TI                         were made but unsuccesful. No clinical symptoms of IBD                         but if these arise, can perform CTE for further                         evaluation. Recommendation:        - Discharge patient to home.                        -  Resume previous diet.                        - Continue present medications.                        - Await pathology results.                        - Repeat colonoscopy for surveillance based on                         pathology results.                        - Return to referring physician as previously                         scheduled.                        - Cecal AVM's could be source of IDA. Recommend PO/IV                         iron treatment but if not responsive, repeat                         colonoscopy can be performed for APC's of AVM's Procedure Code(s):     --- Professional ---                        [phone], Colonoscopy, flexible; with removal of                         tumor(s), polyp(s), or other lesion(s) by snare                         technique Diagnosis Code(s):     --- Professional ---                        K55.20, Angiodysplasia of colon without hemorrhage                         K63.5, Polyp of colon                        D50.9, Iron deficiency anemia, unspecified                        K57.30, Diverticulosis of large intestine without                         perforation or abscess without bleeding CPT copyright 2019 [employer]. All rights reserved. The codes documented in this report are preliminary and upon coder review may  be revised to meet current compliance requirements. Andrey Farmer MD, MD 04/06/2021 10:20:53 AM Number of Addenda: 0 Note Initiated On: 04/06/2021 9:13 AM Scope Withdrawal Time: 0 hours 15 minutes 47 seconds  Total Procedure Duration: 0 hours 21 minutes 53 seconds  Estimated Blood Loss:  Estimated blood loss was minimal.      [facility]

## 2021-04-06 NOTE — Op Note (Signed)
[facility] Patient Name: Erica Castillo Procedure Date: 04/06/2021 9:14 AM MRN: [id number] Account #: [id number] Date of Birth: 02-Dec-1950 Admit Type: Outpatient Age: 71 Room: [facility] ENDO ROOM 1 Gender: Female Note Status: Finalized Instrument Name: Altamese Cabal Endoscope [id number] Procedure:             Upper GI endoscopy Indications:           Iron deficiency anemia Providers:             Andrey Farmer MD, MD Medicines:             Monitored Anesthesia Care Complications:         No immediate complications. Estimated blood loss:                         Minimal. Procedure:             Pre-Anesthesia Assessment:                        - Prior to the procedure, a History and Physical was                         performed, and patient medications and allergies were                         reviewed. The patient is competent. The risks and                         benefits of the procedure and the sedation options and                         risks were discussed with the patient. All questions                         were answered and informed consent was obtained.                         Patient identification and proposed procedure were                         verified by the physician, the nurse, the                         anesthesiologist, the anesthetist and the technician                         in the endoscopy suite. Mental Status Examination:                         alert and oriented. Airway Examination: normal                         oropharyngeal airway and neck mobility. Respiratory                         Examination: clear to auscultation. CV Examination:                         normal. Prophylactic Antibiotics: The patient does not  require prophylactic antibiotics. Prior                         Anticoagulants: The patient has taken no previous                         anticoagulant or antiplatelet agents. ASA Grade                          Assessment: II - A patient with mild systemic disease.                         After reviewing the risks and benefits, the patient                         was deemed in satisfactory condition to undergo the                         procedure. The anesthesia plan was to use monitored                         anesthesia care (MAC). Immediately prior to                         administration of medications, the patient was                         re-assessed for adequacy to receive sedatives. The                         heart rate, respiratory rate, oxygen saturations,                         blood pressure, adequacy of pulmonary ventilation, and                         response to care were monitored throughout the                         procedure. The physical status of the patient was                         re-assessed after the procedure.                        After obtaining informed consent, the endoscope was                         passed under direct vision. Throughout the procedure,                         the patient's blood pressure, pulse, and oxygen                         saturations were monitored continuously. The Endoscope                         was introduced through the mouth, and advanced to the  second part of duodenum. The upper GI endoscopy was                         accomplished without difficulty. The patient tolerated                         the procedure well. Findings:      A 2 cm hiatal hernia was present.      The exam of the esophagus was otherwise normal.      A single 10 mm pedunculated and sessile polyp with no bleeding and no       stigmata of recent bleeding was found in the gastric antrum. The polyp       was removed with a hot snare. Resection and retrieval were complete. To       prevent bleeding after the polypectomy, one hemostatic clip was       successfully placed. There was no bleeding during, or at the  end, of the       procedure.      Multiple 2 to 13 mm sessile fundic gland polyps with no bleeding and no       stigmata of recent bleeding were found in the gastric fundus. Biopsies       were taken with a cold forceps for histology. Estimated blood loss was       minimal.      Patchy moderate inflammation characterized by erythema was found in the       gastric antrum. Biopsies were taken with a cold forceps for Helicobacter       pylori testing. Estimated blood loss was minimal.      The examined duodenum was normal. Impression:            - 2 cm hiatal hernia.                        - A single gastric polyp. Resected and retrieved. Clip                         was placed.                        - Multiple fundic gland polyps. Biopsied.                        - Gastritis. Biopsied.                        - Normal examined duodenum. Recommendation:        - Await pathology results.                        - Perform a colonoscopy today. Procedure Code(s):     --- Professional ---                        [phone], Esophagogastroduodenoscopy, flexible,                         transoral; with removal of tumor(s), polyp(s), or                         other lesion(s) by snare technique Diagnosis Code(s):     ---  Professional ---                        K44.9, Diaphragmatic hernia without obstruction or                         gangrene                        K31.7, Polyp of stomach and duodenum                        K29.70, Gastritis, unspecified, without bleeding                        D50.9, Iron deficiency anemia, unspecified CPT copyright 2019 American Medical Association. All rights reserved. The codes documented in this report are preliminary and upon coder review may  be revised to meet current compliance requirements. Andrey Farmer MD, MD 04/06/2021 10:15:23 AM Number of Addenda: 0 Note Initiated On: 04/06/2021 9:14 AM Estimated Blood Loss:  Estimated blood loss was minimal.       [facility]

## 2021-04-07 ENCOUNTER — Encounter: Payer: Self-pay | Admitting: Gastroenterology

## 2021-04-07 LAB — SURGICAL PATHOLOGY

## 2021-04-21 DIAGNOSIS — Z860101 Personal history of adenomatous and serrated colon polyps: Secondary | ICD-10-CM | POA: Insufficient documentation

## 2021-04-21 DIAGNOSIS — K2970 Gastritis, unspecified, without bleeding: Secondary | ICD-10-CM | POA: Insufficient documentation

## 2021-04-21 DIAGNOSIS — K449 Diaphragmatic hernia without obstruction or gangrene: Secondary | ICD-10-CM | POA: Insufficient documentation

## 2021-04-21 DIAGNOSIS — K5730 Diverticulosis of large intestine without perforation or abscess without bleeding: Secondary | ICD-10-CM | POA: Insufficient documentation

## 2021-04-21 DIAGNOSIS — K5520 Angiodysplasia of colon without hemorrhage: Secondary | ICD-10-CM | POA: Insufficient documentation

## 2021-04-21 DIAGNOSIS — K317 Polyp of stomach and duodenum: Secondary | ICD-10-CM | POA: Insufficient documentation

## 2021-05-17 ENCOUNTER — Other Ambulatory Visit: Payer: Self-pay | Admitting: Internal Medicine

## 2021-05-17 DIAGNOSIS — Z1231 Encounter for screening mammogram for malignant neoplasm of breast: Secondary | ICD-10-CM

## 2021-06-22 ENCOUNTER — Ambulatory Visit
Admission: RE | Admit: 2021-06-22 | Discharge: 2021-06-22 | Disposition: A | Discharge status: Home / Self Care | Payer: Medicare Other | Source: Ambulatory Visit | Attending: Internal Medicine | Admitting: Internal Medicine

## 2021-06-22 ENCOUNTER — Other Ambulatory Visit: Payer: Self-pay

## 2021-06-22 DIAGNOSIS — Z1231 Encounter for screening mammogram for malignant neoplasm of breast: Secondary | ICD-10-CM | POA: Insufficient documentation

## 2022-01-07 ENCOUNTER — Inpatient Hospital Stay: Payer: Medicare Other | Attending: Oncology | Admitting: Oncology

## 2022-01-07 ENCOUNTER — Encounter: Payer: Self-pay | Admitting: Oncology

## 2022-01-07 ENCOUNTER — Inpatient Hospital Stay: Payer: Medicare Other

## 2022-01-07 VITALS — BP 127/67 | HR 89 | Temp 98.1°F | Resp 16 | Wt 140.4 lb

## 2022-01-07 DIAGNOSIS — I10 Essential (primary) hypertension: Secondary | ICD-10-CM | POA: Insufficient documentation

## 2022-01-07 DIAGNOSIS — Z79899 Other long term (current) drug therapy: Secondary | ICD-10-CM | POA: Insufficient documentation

## 2022-01-07 DIAGNOSIS — E119 Type 2 diabetes mellitus without complications: Secondary | ICD-10-CM | POA: Diagnosis not present

## 2022-01-07 DIAGNOSIS — D509 Iron deficiency anemia, unspecified: Secondary | ICD-10-CM | POA: Diagnosis not present

## 2022-01-07 DIAGNOSIS — E538 Deficiency of other specified B group vitamins: Secondary | ICD-10-CM | POA: Diagnosis not present

## 2022-01-07 NOTE — Progress Notes (Signed)
Hematology/Oncology Consult note [facility] [phone] [phone]  Patient Care Team: Tracie Harrier, MD as PCP - General (Internal Medicine)   Name of the patient: Erica Castillo  [id number]  30-Sep-1950    Reason for referral-anemia   Referring physician-Dr. Ginette Pitman  Date of visit: 01/07/22   History of presenting illness-patient is a 71 year old female who was seen by me back in 2019 for leukocytosis which was thought to be reactive.  Peripheral flow cytometry did not reveal any immunophenotypic abnormality and BCR ABL testing was normal.  She has now been referred for anemia.  Most recent CBC shows a white cell count of 15.4, H&H of 11.7/38 with an MCV of 84 and a platelet count of 545.  Her white cell count has been between 11-12 at least dating back to 2015.  Also her platelet count has mostly been within normal limits but since May 2023 she was noted to have an elevated platelet count as well.  Hemoglobin has been mostly around 12 iron studies recently showed a ferritin level of 4 TSH was normal B12 levels were low at 199  ECOG PS- 1  Pain scale- 0   Review of systems- Review of Systems  Constitutional:  Positive for malaise/fatigue. Negative for chills, fever and weight loss.  HENT:  Negative for congestion, ear discharge and nosebleeds.   Eyes:  Negative for blurred vision.  Respiratory:  Negative for cough, hemoptysis, sputum production, shortness of breath and wheezing.   Cardiovascular:  Negative for chest pain, palpitations, orthopnea and claudication.  Gastrointestinal:  Negative for abdominal pain, blood in stool, constipation, diarrhea, heartburn, melena, nausea and vomiting.  Genitourinary:  Negative for dysuria, flank pain, frequency, hematuria and urgency.  Musculoskeletal:  Negative for back pain, joint pain and myalgias.  Skin:  Negative for rash.  Neurological:  Negative for dizziness, tingling, focal weakness,  seizures, weakness and headaches.  Endo/Heme/Allergies:  Does not bruise/bleed easily.  Psychiatric/Behavioral:  Negative for depression and suicidal ideas. The patient does not have insomnia.     No Known Allergies  Patient Active Problem List   Diagnosis Date Noted   Controlled type 2 diabetes mellitus without complication, without long-term current use of insulin ([place]) 07/06/2016   Type 2 diabetes mellitus without complication, without long-term current use of insulin ([place]) 03/29/2016   Essential hypertension 08/19/2014   DDD (degenerative disc disease), lumbar 04/22/2014   Anxiety 01/02/2014     Past Medical History:  Diagnosis Date   Anemia    HX of   Arthritis    Back problem    COVID-19    12/20   Diabetes mellitus without complication ([place])    Gout    History of seizures    under control x 10 years   Hypercholesterolemia    Hypertension    Indigestion    Vertigo    in past     Past Surgical History:  Procedure Laterality Date   ABDOMINAL HYSTERECTOMY     CATARACT EXTRACTION W/PHACO Right 09/09/2019   Procedure: CATARACT EXTRACTION PHACO AND INTRAOCULAR LENS PLACEMENT ([place]) RIGHT DIABETIC 3.85  00:31.3;  Surgeon: Eulogio Bear, MD;  Location: [place];  Service: Ophthalmology;  Laterality: Right;  Diabetic   CATARACT EXTRACTION W/PHACO Left 09/30/2019   Procedure: CATARACT EXTRACTION PHACO AND INTRAOCULAR LENS PLACEMENT ([facility]) LEFT DIABETIC 2.69  00:28.7;  Surgeon: Eulogio Bear, MD;  Location: [place];  Service: Ophthalmology;  Laterality: Left;  Diabetic - oral meds  CHOLECYSTECTOMY     COLONOSCOPY     COLONOSCOPY WITH PROPOFOL N/A 04/06/2021   Procedure: COLONOSCOPY WITH PROPOFOL;  Surgeon: Lesly Rubenstein, MD;  Location: [facility];  Service: Endoscopy;  Laterality: N/A;  DM   ESOPHAGOGASTRODUODENOSCOPY (EGD) WITH PROPOFOL N/A 04/06/2021   Procedure: ESOPHAGOGASTRODUODENOSCOPY (EGD) WITH PROPOFOL;  Surgeon: Lesly Rubenstein, MD;  Location: [facility];  Service: Endoscopy;  Laterality: N/A;   INCONTINENCE SURGERY     bladder suspension    Social History   Socioeconomic History   Marital status: Divorced    Spouse name: Not on file   Number of children: Not on file   Years of education: Not on file   Highest education level: Not on file  Occupational History   Not on file  Tobacco Use   Smoking status: Never   Smokeless tobacco: Never  Vaping Use   Vaping Use: Never used  Substance and Sexual Activity   Alcohol use: No   Drug use: No   Sexual activity: Not on file  Other Topics Concern   Not on file  Social History Narrative   Not on file   Social Determinants of Health   Financial Resource Strain: Not on file  Food Insecurity: Not on file  Transportation Needs: Not on file  Physical Activity: Not on file  Stress: Not on file  Social Connections: Not on file  Intimate Partner Violence: Not on file     Family History  Problem Relation Age of Onset   Diabetes Mother    Hypertension Mother    Hypertension Father    Breast cancer Niece      Current Outpatient Medications:    acetaminophen (TYLENOL) 500 MG tablet, Take 500 mg by mouth every 6 (six) hours as needed., Disp: , Rfl:    amLODipine (NORVASC) 5 MG tablet, Take 5 mg by mouth daily., Disp: , Rfl:    aspirin EC 81 MG tablet, Take 81 mg by mouth daily., Disp: , Rfl:    benazepril (LOTENSIN) 40 MG tablet, Take 40 mg by mouth daily., Disp: , Rfl:    clotrimazole (LOTRIMIN) 1 % cream, Apply 1 Application topically as needed. On foot., Disp: , Rfl:    dapagliflozin propanediol (FARXIGA) 5 MG TABS tablet, Take 1 tablet by mouth every morning, Disp: , Rfl:    Dulaglutide (TRULICITY) 5.05 LZ/7.6BH SOPN, Inject into the skin., Disp: , Rfl:    glipiZIDE (GLUCOTROL) 5 MG tablet, 5 mg 2 (two) times daily before a meal. , Disp: , Rfl: 5   levETIRAcetam (KEPPRA) 500 MG tablet, Take 500 mg by mouth 2 (two) times daily. , Disp: ,  Rfl:    metoprolol succinate (TOPROL-XL) 25 MG 24 hr tablet, 50 mg daily. , Disp: , Rfl:    omeprazole (PRILOSEC) 20 MG capsule, TAKE 1 CAPSULE(20 MG) BY MOUTH EVERY DAY, Disp: , Rfl:    oxybutynin (DITROPAN) 5 MG tablet, Take 5 mg by mouth 2 (two) times daily. , Disp: , Rfl: 5   simvastatin (ZOCOR) 20 MG tablet, TAKE 1 TABLET BY MOUTH NIGHTLY., Disp: , Rfl:    amLODipine (NORVASC) 10 MG tablet, Take 1 tablet by mouth daily. (Patient not taking: Reported on 01/07/2022), Disp: , Rfl:    CINNAMON PO, Take by mouth daily. (Patient not taking: Reported on 01/07/2022), Disp: , Rfl:    colchicine 0.6 MG tablet, Take 0.6 mg by mouth daily as needed. (Patient not taking: Reported on 01/07/2022), Disp: , Rfl:  meloxicam (MOBIC) 7.5 MG tablet, Take 7.5 mg by mouth daily.  (Patient not taking: Reported on 01/07/2022), Disp: , Rfl: 5   traMADol (ULTRAM) 50 MG tablet, 1/2-1 po bid prn (Patient not taking: Reported on 01/07/2022), Disp: , Rfl:    Physical exam:  Vitals:   01/07/22 1336  BP: 127/67  Pulse: 89  Resp: 16  Temp: 98.1 F (36.7 C)  SpO2: 98%  Weight: 140 lb 6.4 oz (63.7 kg)   Physical Exam Constitutional:      General: She is not in acute distress. Cardiovascular:     Rate and Rhythm: Normal rate and regular rhythm.     Heart sounds: Normal heart sounds.  Pulmonary:     Effort: Pulmonary effort is normal.     Breath sounds: Normal breath sounds.  Abdominal:     General: Bowel sounds are normal.     Palpations: Abdomen is soft.  Skin:    General: Skin is warm and dry.  Neurological:     Mental Status: She is alert and oriented to person, place, and time.           Latest Ref Rng & Units 01/24/2012   12:13 PM  CMP  Glucose 65 - 99 mg/dL 129   BUN 7 - 18 mg/dL 10   Creatinine 0.60 - 1.30 mg/dL 0.85   Sodium 136 - 145 mmol/L 139   Potassium 3.5 - 5.1 mmol/L 4.1   Chloride 98 - 107 mmol/L 104   CO2 21 - 32 mmol/L 27   Calcium 8.5 - 10.1 mg/dL 9.0   Total Protein 6.4 - 8.2  g/dL 7.6   Total Bilirubin 0.2 - 1.0 mg/dL 0.8   Alkaline Phos 50 - 136 Unit/L 109   AST 15 - 37 Unit/L 35   ALT 12 - 78 U/L 29       Latest Ref Rng & Units 08/18/2016    3:15 PM  CBC  WBC 3.6 - 11.0 K/uL 12.5   Hemoglobin 12.0 - 16.0 g/dL 12.0   Hematocrit 35.0 - 47.0 % 35.8   Platelets 150 - 440 K/uL 338     Assessment and plan- Patient is a 71 y.o. female referred for anemia  Most recent labs from 12/14/2021 does show evidence of iron deficiency given that ferritin levels were low at 4 along with low B12 levels of 199.  Suspect thrombocytosis can be reactive secondary to iron deficiency.  I will plan to give her IV iron either Venofer x5 or Feraheme x2 based on what her insurance will approve.  Discussed risks and benefits of IV iron including all but not limited to possible risk of infusion and anaphylactic reaction.  Patient understands and agrees to proceed as planned.  We will also plan to give 1 dose of B12 along with IV iron.  Patient will continue oral B12 1000 mcg daily.  I will see her back in about 2 to 2-1/2 months with repeat CBC ferritin and iron studies and B12 levels   Thank you for this kind referral and the opportunity to participate in the care of this patient   Visit Diagnosis 1. Iron deficiency anemia, unspecified iron deficiency anemia type   2. B12 deficiency     Dr. Randa Evens, MD, MPH [facility] [phone] 01/07/2022

## 2022-01-13 ENCOUNTER — Encounter: Payer: Self-pay | Admitting: Oncology

## 2022-01-13 DIAGNOSIS — D509 Iron deficiency anemia, unspecified: Secondary | ICD-10-CM | POA: Insufficient documentation

## 2022-01-25 ENCOUNTER — Inpatient Hospital Stay: Payer: Medicare Other

## 2022-01-25 VITALS — BP 113/66 | HR 97 | Temp 97.3°F | Resp 18

## 2022-01-25 DIAGNOSIS — D509 Iron deficiency anemia, unspecified: Secondary | ICD-10-CM

## 2022-01-25 MED ORDER — CYANOCOBALAMIN 1000 MCG/ML IJ SOLN
1000.0000 ug | INTRAMUSCULAR | Status: DC
  Administered 2022-01-25: 1000 ug via INTRAMUSCULAR
  Filled 2022-01-25: qty 1

## 2022-01-25 MED ORDER — SODIUM CHLORIDE 0.9 % IV SOLN
Freq: Once | INTRAVENOUS | Status: AC
  Filled 2022-01-25: qty 250

## 2022-01-25 MED ORDER — SODIUM CHLORIDE 0.9 % IV SOLN
510.0000 mg | INTRAVENOUS | Status: DC
  Administered 2022-01-25: 510 mg via INTRAVENOUS
  Filled 2022-01-25: qty 510

## 2022-01-25 NOTE — Patient Instructions (Signed)
[facility]  Discharge Instructions: Thank you for choosing [facility] to provide your oncology and hematology care.  If you have a lab appointment with the Cancer Center, please go directly to the Cancer Center and check in at the registration area.  Wear comfortable clothing and clothing appropriate for easy access to any Portacath or PICC line.   We strive to give you quality time with your provider. You may need to reschedule your appointment if you arrive late (15 or more minutes).  Arriving late affects you and other patients whose appointments are after yours.  Also, if you miss three or more appointments without notifying the office, you may be dismissed from the clinic at the provider's discretion.      For prescription refill requests, have your pharmacy contact our office and allow 72 hours for refills to be completed.    Today you received the following chemotherapy and/or immunotherapy agents FERAHEME      To help prevent nausea and vomiting after your treatment, we encourage you to take your nausea medication as directed.  BELOW ARE SYMPTOMS THAT SHOULD BE REPORTED IMMEDIATELY: *FEVER GREATER THAN 100.4 F (38 C) OR HIGHER *CHILLS OR SWEATING *NAUSEA AND VOMITING THAT IS NOT CONTROLLED WITH YOUR NAUSEA MEDICATION *UNUSUAL SHORTNESS OF BREATH *UNUSUAL BRUISING OR BLEEDING *URINARY PROBLEMS (pain or burning when urinating, or frequent urination) *BOWEL PROBLEMS (unusual diarrhea, constipation, pain near the anus) TENDERNESS IN MOUTH AND THROAT WITH OR WITHOUT PRESENCE OF ULCERS (sore throat, sores in mouth, or a toothache) UNUSUAL RASH, SWELLING OR PAIN  UNUSUAL VAGINAL DISCHARGE OR ITCHING   Items with * indicate a potential emergency and should be followed up as soon as possible or go to the Emergency Department if any problems should occur.  Please show the CHEMOTHERAPY ALERT CARD or IMMUNOTHERAPY ALERT CARD at check-in to  the Emergency Department and triage nurse.  Should you have questions after your visit or need to cancel or reschedule your appointment, please contact [facility]  [phone] and follow the prompts.  Office hours are 8:00 a.m. to 4:30 p.m. Monday - Friday. Please note that voicemails left after 4:00 p.m. may not be returned until the following business day.  We are closed weekends and major holidays. You have access to a nurse at all times for urgent questions. Please call the main number to the clinic [phone] and follow the prompts.  For any non-urgent questions, you may also contact your provider using MyChart. We now offer e-Visits for anyone 18 and older to request care online for non-urgent symptoms. For details visit [email or web address].   Also download the MyChart app! Go to the app store, search "MyChart", open the app, select [facility], and log in with your MyChart username and password.  Masks are optional in the cancer centers. If you would like for your care team to wear a mask while they are taking care of you, please let them know. For doctor visits, patients may have with them one support person who is at least 72 years old. At this time, visitors are not allowed in the infusion area.  Ferumoxytol Injection What is this medication? FERUMOXYTOL (FER ue MOX i tol) treats low levels of iron in your body (iron deficiency anemia). Iron is a mineral that plays an important role in making red blood cells, which carry oxygen from your lungs to the rest of your body. This medicine may be used for other   purposes; ask your health care provider or pharmacist if you have questions. COMMON BRAND NAME(S): Feraheme What should I tell my care team before I take this medication? They need to know if you have any of these conditions: Anemia not caused by low iron levels High levels of iron in the blood Magnetic resonance imaging (MRI) test scheduled An  unusual or allergic reaction to iron, other medications, foods, dyes, or preservatives Pregnant or trying to get pregnant Breast-feeding How should I use this medication? This medication is for injection into a vein. It is given in a hospital or clinic setting. Talk to your care team the use of this medication in children. Special care may be needed. Overdosage: If you think you have taken too much of this medicine contact a poison control center or emergency room at once. NOTE: This medicine is only for you. Do not share this medicine with others. What if I miss a dose? It is important not to miss your dose. Call your care team if you are unable to keep an appointment. What may interact with this medication? Other iron products This list may not describe all possible interactions. Give your health care provider a list of all the medicines, herbs, non-prescription drugs, or dietary supplements you use. Also tell them if you smoke, drink alcohol, or use illegal drugs. Some items may interact with your medicine. What should I watch for while using this medication? Visit your care team regularly. Tell your care team if your symptoms do not start to get better or if they get worse. You may need blood work done while you are taking this medication. You may need to follow a special diet. Talk to your care team. Foods that contain iron include: whole grains/cereals, dried fruits, beans, or peas, leafy green vegetables, and organ meats (liver, kidney). What side effects may I notice from receiving this medication? Side effects that you should report to your care team as soon as possible: Allergic reactions--skin rash, itching, hives, swelling of the face, lips, tongue, or throat Low blood pressure--dizziness, feeling faint or lightheaded, blurry vision Shortness of breath Side effects that usually do not require medical attention (report to your care team if they continue or are  bothersome): Flushing Headache Joint pain Muscle pain Nausea Pain, redness, or irritation at injection site This list may not describe all possible side effects. Call your doctor for medical advice about side effects. You may report side effects to FDA at 1-800-FDA-1088. Where should I keep my medication? This medication is given in a hospital or clinic and will not be stored at home. NOTE: This sheet is a summary. It may not cover all possible information. If you have questions about this medicine, talk to your doctor, pharmacist, or health care provider.  2023 Elsevier/Gold Standard (2020-08-14 00:00:00)    

## 2022-02-01 ENCOUNTER — Inpatient Hospital Stay: Payer: Medicare Other

## 2022-02-01 VITALS — BP 149/77 | HR 104 | Temp 97.4°F | Resp 18

## 2022-02-01 DIAGNOSIS — D509 Iron deficiency anemia, unspecified: Secondary | ICD-10-CM

## 2022-02-01 MED ORDER — SODIUM CHLORIDE 0.9 % IV SOLN
Freq: Once | INTRAVENOUS | Status: AC
  Filled 2022-02-01: qty 250

## 2022-02-01 MED ORDER — SODIUM CHLORIDE 0.9 % IV SOLN
510.0000 mg | INTRAVENOUS | Status: DC
  Administered 2022-02-01: 510 mg via INTRAVENOUS
  Filled 2022-02-01: qty 510

## 2022-02-01 NOTE — Patient Instructions (Signed)

## 2022-03-15 ENCOUNTER — Inpatient Hospital Stay: Payer: Medicare Other | Attending: Oncology

## 2022-03-15 DIAGNOSIS — D509 Iron deficiency anemia, unspecified: Secondary | ICD-10-CM

## 2022-03-15 DIAGNOSIS — D519 Vitamin B12 deficiency anemia, unspecified: Secondary | ICD-10-CM | POA: Diagnosis present

## 2022-03-15 LAB — CBC WITH DIFFERENTIAL/PLATELET
Abs Immature Granulocytes: 0.05 10*3/uL (ref 0.00–0.07)
Basophils Absolute: 0.1 10*3/uL (ref 0.0–0.1)
Basophils Relative: 1 %
Eosinophils Absolute: 0.1 10*3/uL (ref 0.0–0.5)
Eosinophils Relative: 1 %
HCT: 41.1 % (ref 36.0–46.0)
Hemoglobin: 13.3 g/dL (ref 12.0–15.0)
Immature Granulocytes: 0 %
Lymphocytes Relative: 20 %
Lymphs Abs: 2.8 10*3/uL (ref 0.7–4.0)
MCH: 29.2 pg (ref 26.0–34.0)
MCHC: 32.4 g/dL (ref 30.0–36.0)
MCV: 90.1 fL (ref 80.0–100.0)
Monocytes Absolute: 0.7 10*3/uL (ref 0.1–1.0)
Monocytes Relative: 5 %
Neutro Abs: 9.9 10*3/uL — ABNORMAL HIGH (ref 1.7–7.7)
Neutrophils Relative %: 73 %
Platelets: 366 10*3/uL (ref 150–400)
RBC: 4.56 MIL/uL (ref 3.87–5.11)
RDW: 17.9 % — ABNORMAL HIGH (ref 11.5–15.5)
WBC: 13.6 10*3/uL — ABNORMAL HIGH (ref 4.0–10.5)
nRBC: 0 % (ref 0.0–0.2)

## 2022-03-15 LAB — IRON AND TIBC
Iron: 74 ug/dL (ref 28–170)
Saturation Ratios: 27 % (ref 10.4–31.8)
TIBC: 270 ug/dL (ref 250–450)
UIBC: 196 ug/dL

## 2022-03-15 LAB — VITAMIN B12: Vitamin B-12: 291 pg/mL (ref 180–914)

## 2022-03-15 LAB — FERRITIN: Ferritin: 141 ng/mL (ref 11–307)

## 2022-03-22 ENCOUNTER — Telehealth: Payer: Medicare Other | Admitting: Oncology

## 2022-03-22 ENCOUNTER — Inpatient Hospital Stay: Payer: Medicare Other

## 2022-03-22 ENCOUNTER — Inpatient Hospital Stay (HOSPITAL_BASED_OUTPATIENT_CLINIC_OR_DEPARTMENT_OTHER): Payer: Medicare Other | Admitting: Oncology

## 2022-03-22 ENCOUNTER — Encounter: Payer: Self-pay | Admitting: Oncology

## 2022-03-22 VITALS — BP 141/74 | HR 93 | Temp 96.9°F | Resp 16 | Wt 141.2 lb

## 2022-03-22 DIAGNOSIS — D519 Vitamin B12 deficiency anemia, unspecified: Secondary | ICD-10-CM | POA: Diagnosis not present

## 2022-03-22 DIAGNOSIS — D509 Iron deficiency anemia, unspecified: Secondary | ICD-10-CM | POA: Diagnosis not present

## 2022-03-22 DIAGNOSIS — E538 Deficiency of other specified B group vitamins: Secondary | ICD-10-CM

## 2022-03-22 NOTE — Progress Notes (Signed)
Hematology/Oncology Consult note [facility]  Telephone:([phone] [phone]  Patient Care Team: Tracie Harrier, MD as PCP - General (Internal Medicine)   Name of the patient: Alexis Reber  [id number]  [birth year]   Date of visit: 03/22/22  Diagnosis-iron and B12 deficiency anemia  Chief complaint/ Reason for visit-routine follow-up of anemia  Heme/Onc history: patient is a 71 year old female who was seen by me back in 2019 for leukocytosis which was thought to be reactive.  Peripheral flow cytometry did not reveal any immunophenotypic abnormality and BCR ABL testing was normal.  She has now been referred for anemia.  Most recent CBC shows a white cell count of 15.4, H&H of 11.7/38 with an MCV of 84 and a platelet count of 545.  Her white cell count has been between 11-12 at least dating back to 2015.  Also her platelet count has mostly been within normal limits but since May 2023 she was noted to have an elevated platelet count as well.  Hemoglobin has been mostly around 12 iron studies recently showed a ferritin level of 4 TSH was normal B12 levels were low at 199   Patient received 2 doses of Feraheme in October 2023 and 1 dose of B12 injection.  Interval history-patient has been getting B12 injections through her PCP every 3 months.  Reports that her energy levels are better.  Denies any specific complaints at this time  ECOG PS- 1 Pain scale- 0   Review of systems- Review of Systems  Constitutional:  Negative for chills, fever, malaise/fatigue and weight loss.  HENT:  Negative for congestion, ear discharge and nosebleeds.   Eyes:  Negative for blurred vision.  Respiratory:  Negative for cough, hemoptysis, sputum production, shortness of breath and wheezing.   Cardiovascular:  Negative for chest pain, palpitations, orthopnea and claudication.  Gastrointestinal:  Negative for abdominal pain, blood in stool, constipation, diarrhea,  heartburn, melena, nausea and vomiting.  Genitourinary:  Negative for dysuria, flank pain, frequency, hematuria and urgency.  Musculoskeletal:  Negative for back pain, joint pain and myalgias.  Skin:  Negative for rash.  Neurological:  Negative for dizziness, tingling, focal weakness, seizures, weakness and headaches.  Endo/Heme/Allergies:  Does not bruise/bleed easily.  Psychiatric/Behavioral:  Negative for depression and suicidal ideas. The patient does not have insomnia.       No Known Allergies   Past Medical History:  Diagnosis Date   Anemia    HX of   Arthritis    Back problem    COVID-19    12/20   Diabetes mellitus without complication ([place])    Gout    History of seizures    under control x 10 years   Hypercholesterolemia    Hypertension    Indigestion    Vertigo    in past     Past Surgical History:  Procedure Laterality Date   ABDOMINAL HYSTERECTOMY     CATARACT EXTRACTION W/PHACO Right 09/09/2019   Procedure: CATARACT EXTRACTION PHACO AND INTRAOCULAR LENS PLACEMENT ([place]) RIGHT DIABETIC 3.85  00:31.3;  Surgeon: Eulogio Bear, MD;  Location: [place];  Service: Ophthalmology;  Laterality: Right;  Diabetic   CATARACT EXTRACTION W/PHACO Left 09/30/2019   Procedure: CATARACT EXTRACTION PHACO AND INTRAOCULAR LENS PLACEMENT ([facility]) LEFT DIABETIC 2.69  00:28.7;  Surgeon: Eulogio Bear, MD;  Location: [place];  Service: Ophthalmology;  Laterality: Left;  Diabetic - oral meds   CHOLECYSTECTOMY     COLONOSCOPY  COLONOSCOPY WITH PROPOFOL N/A 04/06/2021   Procedure: COLONOSCOPY WITH PROPOFOL;  Surgeon: Lesly Rubenstein, MD;  Location: [facility];  Service: Endoscopy;  Laterality: N/A;  DM   ESOPHAGOGASTRODUODENOSCOPY (EGD) WITH PROPOFOL N/A 04/06/2021   Procedure: ESOPHAGOGASTRODUODENOSCOPY (EGD) WITH PROPOFOL;  Surgeon: Lesly Rubenstein, MD;  Location: [facility];  Service: Endoscopy;  Laterality: N/A;   INCONTINENCE SURGERY      bladder suspension    Social History   Socioeconomic History   Marital status: Divorced    Spouse name: Not on file   Number of children: Not on file   Years of education: Not on file   Highest education level: Not on file  Occupational History   Not on file  Tobacco Use   Smoking status: Never   Smokeless tobacco: Never  Vaping Use   Vaping Use: Never used  Substance and Sexual Activity   Alcohol use: No   Drug use: No   Sexual activity: Not on file  Other Topics Concern   Not on file  Social History Narrative   Not on file   Social Determinants of Health   Financial Resource Strain: Not on file  Food Insecurity: Not on file  Transportation Needs: Not on file  Physical Activity: Not on file  Stress: Not on file  Social Connections: Not on file  Intimate Partner Violence: Not on file    Family History  Problem Relation Age of Onset   Diabetes Mother    Hypertension Mother    Hypertension Father    Breast cancer Niece      Current Outpatient Medications:    acetaminophen (TYLENOL) 500 MG tablet, Take 500 mg by mouth every 6 (six) hours as needed., Disp: , Rfl:    amLODipine (NORVASC) 10 MG tablet, Take 1 tablet by mouth daily. (Patient not taking: Reported on 01/07/2022), Disp: , Rfl:    amLODipine (NORVASC) 5 MG tablet, Take 5 mg by mouth daily., Disp: , Rfl:    aspirin EC 81 MG tablet, Take 81 mg by mouth daily., Disp: , Rfl:    benazepril (LOTENSIN) 40 MG tablet, Take 40 mg by mouth daily., Disp: , Rfl:    CINNAMON PO, Take by mouth daily. (Patient not taking: Reported on 01/07/2022), Disp: , Rfl:    clotrimazole (LOTRIMIN) 1 % cream, Apply 1 Application topically as needed. On foot., Disp: , Rfl:    colchicine 0.6 MG tablet, Take 0.6 mg by mouth daily as needed. (Patient not taking: Reported on 01/07/2022), Disp: , Rfl:    dapagliflozin propanediol (FARXIGA) 5 MG TABS tablet, Take 1 tablet by mouth every morning, Disp: , Rfl:    Dulaglutide (TRULICITY) 4.66  ZL/9.3TT SOPN, Inject into the skin., Disp: , Rfl:    glipiZIDE (GLUCOTROL) 5 MG tablet, 5 mg 2 (two) times daily before a meal. , Disp: , Rfl: 5   levETIRAcetam (KEPPRA) 500 MG tablet, Take 500 mg by mouth 2 (two) times daily. , Disp: , Rfl:    meloxicam (MOBIC) 7.5 MG tablet, Take 7.5 mg by mouth daily.  (Patient not taking: Reported on 01/07/2022), Disp: , Rfl: 5   metoprolol succinate (TOPROL-XL) 25 MG 24 hr tablet, 50 mg daily. , Disp: , Rfl:    omeprazole (PRILOSEC) 20 MG capsule, TAKE 1 CAPSULE(20 MG) BY MOUTH EVERY DAY, Disp: , Rfl:    oxybutynin (DITROPAN) 5 MG tablet, Take 5 mg by mouth 2 (two) times daily. , Disp: , Rfl: 5   simvastatin (ZOCOR) 20 MG  tablet, TAKE 1 TABLET BY MOUTH NIGHTLY., Disp: , Rfl:    traMADol (ULTRAM) 50 MG tablet, 1/2-1 po bid prn (Patient not taking: Reported on 01/07/2022), Disp: , Rfl:   Physical exam:  Vitals:   03/22/22 1300  BP: (!) 141/74  Pulse: 93  Resp: 16  Temp: (!) 96.9 F (36.1 C)  TempSrc: Tympanic  Weight: 141 lb 3.2 oz (64 kg)   Physical Exam Cardiovascular:     Rate and Rhythm: Normal rate and regular rhythm.     Heart sounds: Normal heart sounds.  Pulmonary:     Effort: Pulmonary effort is normal.     Breath sounds: Normal breath sounds.  Abdominal:     General: Bowel sounds are normal.     Palpations: Abdomen is soft.  Skin:    General: Skin is warm and dry.  Neurological:     Mental Status: She is alert and oriented to person, place, and time.         Latest Ref Rng & Units 01/24/2012   12:13 PM  CMP  Glucose 65 - 99 mg/dL 129   BUN 7 - 18 mg/dL 10   Creatinine 0.60 - 1.30 mg/dL 0.85   Sodium 136 - 145 mmol/L 139   Potassium 3.5 - 5.1 mmol/L 4.1   Chloride 98 - 107 mmol/L 104   CO2 21 - 32 mmol/L 27   Calcium 8.5 - 10.1 mg/dL 9.0   Total Protein 6.4 - 8.2 g/dL 7.6   Total Bilirubin 0.2 - 1.0 mg/dL 0.8   Alkaline Phos 50 - 136 Unit/L 109   AST 15 - 37 Unit/L 35   ALT 12 - 78 U/L 29       Latest Ref Rng &  Units 03/15/2022    2:31 PM  CBC  WBC 4.0 - 10.5 K/uL 13.6   Hemoglobin 12.0 - 15.0 g/dL 13.3   Hematocrit 36.0 - 46.0 % 41.1   Platelets 150 - 400 K/uL 366      Assessment and plan- Patient is a 70 y.o. female who is here for follow-up of iron and B12 deficiency anemia  After receiving Feraheme and B12 injection patient's hemoglobin is improved from 11.7-13.3.Ferritin levels are presently normal at 141 with an iron saturation of 27%.  Patient is not iron deficient.  B12 levels continue to be borderline low at 291.  She will receive B12 injection today.  Continue oral B12.  CBC ferritin and iron studies and B12 levels in 3 and 6 months.  See Dr. Janese Banks in 6 months.  Patient gets B12 injections with her primary care doctor and will continue to get that every 3 months.    Visit Diagnosis 1. Iron deficiency anemia, unspecified iron deficiency anemia type   2. B12 deficiency      Dr. Randa Evens, MD, MPH [facility] at [facility] [phone] 03/22/2022 1:14 PM

## 2022-03-22 NOTE — Progress Notes (Signed)
Patient receives B 12 injections every 3-4 months at PCP.  Does feel an improvement in energy with IV iron.

## 2022-05-16 ENCOUNTER — Other Ambulatory Visit: Payer: Self-pay | Admitting: Internal Medicine

## 2022-05-16 DIAGNOSIS — Z1231 Encounter for screening mammogram for malignant neoplasm of breast: Secondary | ICD-10-CM

## 2022-06-21 ENCOUNTER — Inpatient Hospital Stay: Payer: Medicare Other | Attending: Oncology

## 2022-06-28 ENCOUNTER — Ambulatory Visit
Admission: RE | Admit: 2022-06-28 | Discharge: 2022-06-28 | Disposition: A | Discharge status: Home / Self Care | Payer: Medicare Other | Source: Ambulatory Visit | Attending: Internal Medicine | Admitting: Internal Medicine

## 2022-06-28 DIAGNOSIS — Z1231 Encounter for screening mammogram for malignant neoplasm of breast: Secondary | ICD-10-CM | POA: Insufficient documentation

## 2022-09-21 ENCOUNTER — Inpatient Hospital Stay: Payer: Medicare Other | Attending: Oncology

## 2022-09-21 ENCOUNTER — Encounter: Payer: Self-pay | Admitting: Medical Oncology

## 2022-09-21 ENCOUNTER — Inpatient Hospital Stay (HOSPITAL_BASED_OUTPATIENT_CLINIC_OR_DEPARTMENT_OTHER): Payer: Medicare Other | Admitting: Medical Oncology

## 2022-09-21 VITALS — BP 140/66 | HR 87 | Temp 97.5°F | Wt 142.0 lb

## 2022-09-21 DIAGNOSIS — E538 Deficiency of other specified B group vitamins: Secondary | ICD-10-CM

## 2022-09-21 DIAGNOSIS — D509 Iron deficiency anemia, unspecified: Secondary | ICD-10-CM | POA: Insufficient documentation

## 2022-09-21 DIAGNOSIS — D519 Vitamin B12 deficiency anemia, unspecified: Secondary | ICD-10-CM | POA: Insufficient documentation

## 2022-09-21 LAB — CBC WITH DIFFERENTIAL/PLATELET
Abs Immature Granulocytes: 0.06 10*3/uL (ref 0.00–0.07)
Basophils Absolute: 0.1 10*3/uL (ref 0.0–0.1)
Basophils Relative: 1 %
Eosinophils Absolute: 0.1 10*3/uL (ref 0.0–0.5)
Eosinophils Relative: 1 %
HCT: 41.2 % (ref 36.0–46.0)
Hemoglobin: 13.4 g/dL (ref 12.0–15.0)
Immature Granulocytes: 1 %
Lymphocytes Relative: 27 %
Lymphs Abs: 3.1 10*3/uL (ref 0.7–4.0)
MCH: 30.7 pg (ref 26.0–34.0)
MCHC: 32.5 g/dL (ref 30.0–36.0)
MCV: 94.5 fL (ref 80.0–100.0)
Monocytes Absolute: 0.5 10*3/uL (ref 0.1–1.0)
Monocytes Relative: 5 %
Neutro Abs: 7.4 10*3/uL (ref 1.7–7.7)
Neutrophils Relative %: 65 %
Platelets: 369 10*3/uL (ref 150–400)
RBC: 4.36 MIL/uL (ref 3.87–5.11)
RDW: 12.3 % (ref 11.5–15.5)
WBC: 11.3 10*3/uL — ABNORMAL HIGH (ref 4.0–10.5)
nRBC: 0 % (ref 0.0–0.2)

## 2022-09-21 LAB — IRON AND TIBC
Iron: 67 ug/dL (ref 28–170)
Saturation Ratios: 23 % (ref 10.4–31.8)
TIBC: 295 ug/dL (ref 250–450)
UIBC: 228 ug/dL

## 2022-09-21 LAB — VITAMIN B12: Vitamin B-12: 376 pg/mL (ref 180–914)

## 2022-09-21 LAB — FERRITIN: Ferritin: 44 ng/mL (ref 11–307)

## 2022-09-21 NOTE — Progress Notes (Signed)
Hematology/Oncology Consult note [facility]  [phone] [phone]  Patient Care Team: Barbette Reichmann, MD as PCP - General (Internal Medicine)   Name of the patient: Erica Castillo  [id number]  [birth year]   Date of visit: 09/21/22  Diagnosis-iron and B12 deficiency anemia  Chief complaint/ Reason for visit-routine follow-up of anemia  Heme/Onc history: patient is a 72 year old female who was seen by me back in 2019 for leukocytosis which was thought to be reactive.  Peripheral flow cytometry did not reveal any immunophenotypic abnormality and BCR ABL testing was normal.  She has now been referred for anemia.  Most recent CBC shows a white cell count of 15.4, H&H of 11.7/38 with an MCV of 84 and a platelet count of 545.  Her white cell count has been between 11-12 at least dating back to 2015.  Also her platelet count has mostly been within normal limits but since May 2023 she was noted to have an elevated platelet count as well.  Hemoglobin has been mostly around 12 iron studies recently showed a ferritin level of 4 TSH was normal B12 levels were low at 199   Patient received 2 doses of Feraheme in October 2023 and 1 dose of B12 injection.  Interval history:   She is seen for B12 and IDA. Source of IDA of unknown cause. She reports that her last follow up with GI was last years when she had a normal colonoscopy/work up. She does not donate blood. She has a preserved GFR and no history of CKD. She has never had bariatric surgery and does not have any known history of Crohn's/UC. She is a diabetic but is not on metformin. She has no known personal or family history of bleeding/clotting disorders. She is not on any special diets.   Today she states that she has been doing well. Fatigue is much improved. She continues to get her B12 injections through her PCP every 3 months. She is not currently taking any oral iron. Denies any bleeding/bruising  episodes, hematuria, hemoptysis, epistaxis, melena.   Last iron studies were performed at [facility] on 04/20/2022 which showed a ferritin of 49, B12 of 284.   ECOG PS- 1 Pain scale- 0   Review of systems- Review of Systems  Constitutional:  Negative for chills, fever, malaise/fatigue and weight loss.  HENT:  Negative for congestion, ear discharge and nosebleeds.   Eyes:  Negative for blurred vision.  Respiratory:  Negative for cough, hemoptysis, sputum production, shortness of breath and wheezing.   Cardiovascular:  Negative for chest pain, palpitations, orthopnea and claudication.  Gastrointestinal:  Negative for abdominal pain, blood in stool, constipation, diarrhea, heartburn, melena, nausea and vomiting.  Genitourinary:  Negative for dysuria, flank pain, frequency, hematuria and urgency.  Musculoskeletal:  Negative for back pain, joint pain and myalgias.  Skin:  Negative for rash.  Neurological:  Negative for dizziness, tingling, focal weakness, seizures, weakness and headaches.  Endo/Heme/Allergies:  Does not bruise/bleed easily.  Psychiatric/Behavioral:  Negative for depression and suicidal ideas. The patient does not have insomnia.     No Known Allergies   Past Medical History:  Diagnosis Date   Anemia    HX of   Arthritis    Back problem    COVID-19    12/20   Diabetes mellitus without complication ([facility])    Gout    History of seizures    under control x 10 years   Hypercholesterolemia    Hypertension  Indigestion    Vertigo    in past     Past Surgical History:  Procedure Laterality Date   ABDOMINAL HYSTERECTOMY     CATARACT EXTRACTION W/PHACO Right 09/09/2019   Procedure: CATARACT EXTRACTION PHACO AND INTRAOCULAR LENS PLACEMENT ([facility]) RIGHT DIABETIC 3.85  00:31.3;  Surgeon: Nevada Crane, MD;  Location: [facility];  Service: Ophthalmology;  Laterality: Right;  Diabetic   CATARACT EXTRACTION W/PHACO Left 09/30/2019   Procedure: CATARACT EXTRACTION  PHACO AND INTRAOCULAR LENS PLACEMENT ([facility]) LEFT DIABETIC 2.69  00:28.7;  Surgeon: Nevada Crane, MD;  Location: [facility];  Service: Ophthalmology;  Laterality: Left;  Diabetic - oral meds   CHOLECYSTECTOMY     COLONOSCOPY     COLONOSCOPY WITH PROPOFOL N/A 04/06/2021   Procedure: COLONOSCOPY WITH PROPOFOL;  Surgeon: Regis Bill, MD;  Location: [facility];  Service: Endoscopy;  Laterality: N/A;  DM   ESOPHAGOGASTRODUODENOSCOPY (EGD) WITH PROPOFOL N/A 04/06/2021   Procedure: ESOPHAGOGASTRODUODENOSCOPY (EGD) WITH PROPOFOL;  Surgeon: Regis Bill, MD;  Location: [facility];  Service: Endoscopy;  Laterality: N/A;   INCONTINENCE SURGERY     bladder suspension    Social History   Socioeconomic History   Marital status: Divorced    Spouse name: Not on file   Number of children: Not on file   Years of education: Not on file   Highest education level: Not on file  Occupational History   Not on file  Tobacco Use   Smoking status: Never   Smokeless tobacco: Never  Vaping Use   Vaping Use: Never used  Substance and Sexual Activity   Alcohol use: No   Drug use: No   Sexual activity: Not on file  Other Topics Concern   Not on file  Social History Narrative   Not on file   Social Determinants of Health   Financial Resource Strain: Not on file  Food Insecurity: Not on file  Transportation Needs: Not on file  Physical Activity: Not on file  Stress: Not on file  Social Connections: Not on file  Intimate Partner Violence: Not on file    Family History  Problem Relation Age of Onset   Diabetes Mother    Hypertension Mother    Hypertension Father    Breast cancer Niece      Current Outpatient Medications:    acetaminophen (TYLENOL) 500 MG tablet, Take 500 mg by mouth every 6 (six) hours as needed., Disp: , Rfl:    amLODipine (NORVASC) 5 MG tablet, Take 5 mg by mouth daily., Disp: , Rfl:    aspirin EC 81 MG tablet, Take 81 mg by mouth daily., Disp:  , Rfl:    benazepril (LOTENSIN) 40 MG tablet, Take 40 mg by mouth daily., Disp: , Rfl:    clotrimazole (LOTRIMIN) 1 % cream, Apply 1 Application topically as needed. On foot., Disp: , Rfl:    dapagliflozin propanediol (FARXIGA) 5 MG TABS tablet, Take 1 tablet by mouth every morning, Disp: , Rfl:    glipiZIDE (GLUCOTROL) 5 MG tablet, 5 mg 2 (two) times daily before a meal. , Disp: , Rfl: 5   Insulin Glargine w/ Trans Port 100 UNIT/ML SOPN, Inject into the skin., Disp: , Rfl:    levETIRAcetam (KEPPRA) 500 MG tablet, Take 500 mg by mouth 2 (two) times daily. , Disp: , Rfl:    metoprolol succinate (TOPROL-XL) 25 MG 24 hr tablet, 50 mg daily. , Disp: , Rfl:    omeprazole (PRILOSEC) 20 MG  capsule, TAKE 1 CAPSULE(20 MG) BY MOUTH EVERY DAY, Disp: , Rfl:    oxybutynin (DITROPAN) 5 MG tablet, Take 5 mg by mouth 2 (two) times daily. , Disp: , Rfl: 5   simvastatin (ZOCOR) 20 MG tablet, TAKE 1 TABLET BY MOUTH NIGHTLY., Disp: , Rfl:    amLODipine (NORVASC) 10 MG tablet, Take 1 tablet by mouth daily. (Patient not taking: Reported on 01/07/2022), Disp: , Rfl:    CINNAMON PO, Take by mouth daily. (Patient not taking: Reported on 01/07/2022), Disp: , Rfl:    colchicine 0.6 MG tablet, Take 0.6 mg by mouth daily as needed. (Patient not taking: Reported on 01/07/2022), Disp: , Rfl:    Dulaglutide (TRULICITY) 0.75 MG/0.5ML SOPN, Inject into the skin. (Patient not taking: Reported on 03/22/2022), Disp: , Rfl:    meloxicam (MOBIC) 7.5 MG tablet, Take 7.5 mg by mouth daily.  (Patient not taking: Reported on 01/07/2022), Disp: , Rfl: 5   traMADol (ULTRAM) 50 MG tablet, 1/2-1 po bid prn (Patient not taking: Reported on 01/07/2022), Disp: , Rfl:   Physical exam:  Vitals:   09/21/22 1332  BP: (!) 140/66  Pulse: 87  Temp: (!) 97.5 F (36.4 C)  TempSrc: Tympanic  SpO2: 100%  Weight: 142 lb (64.4 kg)   Physical Exam Vitals and nursing note reviewed.  Constitutional:      General: She is not in acute distress.     Appearance: Normal appearance. She is not ill-appearing, toxic-appearing or diaphoretic.  Cardiovascular:     Rate and Rhythm: Normal rate and regular rhythm.     Heart sounds: Normal heart sounds.  Pulmonary:     Effort: Pulmonary effort is normal.     Breath sounds: Normal breath sounds.  Abdominal:     General: Bowel sounds are normal.     Palpations: Abdomen is soft.  Lymphadenopathy:     Cervical: No cervical adenopathy.  Skin:    General: Skin is warm and dry.     Coloration: Skin is not pale.  Neurological:     Mental Status: She is alert and oriented to person, place, and time.         Latest Ref Rng & Units 01/24/2012   12:13 PM  CMP  Glucose 65 - 99 mg/dL 409   BUN 7 - 18 mg/dL 10   Creatinine 8.11 - 1.30 mg/dL 9.14   Sodium 782 - 956 mmol/L 139   Potassium 3.5 - 5.1 mmol/L 4.1   Chloride 98 - 107 mmol/L 104   CO2 21 - 32 mmol/L 27   Calcium 8.5 - 10.1 mg/dL 9.0   Total Protein 6.4 - 8.2 g/dL 7.6   Total Bilirubin 0.2 - 1.0 mg/dL 0.8   Alkaline Phos 50 - 136 Unit/L 109   AST 15 - 37 Unit/L 35   ALT 12 - 78 U/L 29       Latest Ref Rng & Units 09/21/2022    1:22 PM  CBC  WBC 4.0 - 10.5 K/uL 11.3   Hemoglobin 12.0 - 15.0 g/dL 21.3   Hematocrit 08.6 - 46.0 % 41.2   Platelets 150 - 400 K/uL 369      Assessment and plan- Patient is a 72 y.o. female who is here for follow-up of iron and B12 deficiency anemia  Tolerating Feraheme and B12 therapy well. Counts had previously improved. Today her Hgb is 13.4, MCV is 94.5, platelets are 369. WBC is stable. Iron studies pending but suspected to be normal.  Patient gets B12 injections with her primary care doctor and will continue to get that every 3 months.    RTC 6 months MD/APP, labs ( CBC w/, CMP, B12, iron, ferritin, retic)-    Visit Diagnosis 1. Iron deficiency anemia, unspecified iron deficiency anemia type   2. B12 deficiency      Jannetta Quint [facility] at [facility] [phone] 09/21/2022 1:59 PM

## 2023-01-07 ENCOUNTER — Encounter: Payer: Self-pay | Admitting: Oncology

## 2023-02-06 ENCOUNTER — Encounter (INDEPENDENT_AMBULATORY_CARE_PROVIDER_SITE_OTHER): Payer: Self-pay | Admitting: Nurse Practitioner

## 2023-02-06 ENCOUNTER — Ambulatory Visit (INDEPENDENT_AMBULATORY_CARE_PROVIDER_SITE_OTHER): Payer: Medicare Other | Admitting: Nurse Practitioner

## 2023-02-06 VITALS — BP 143/73 | HR 98 | Resp 16 | Wt 142.4 lb

## 2023-02-06 DIAGNOSIS — I10 Essential (primary) hypertension: Secondary | ICD-10-CM | POA: Diagnosis not present

## 2023-02-06 DIAGNOSIS — E119 Type 2 diabetes mellitus without complications: Secondary | ICD-10-CM | POA: Diagnosis not present

## 2023-02-06 DIAGNOSIS — I739 Peripheral vascular disease, unspecified: Secondary | ICD-10-CM | POA: Diagnosis not present

## 2023-02-06 NOTE — Progress Notes (Signed)
Subjective:    Patient ID: Erica Castillo, female    DOB: 1950/11/22, 72 y.o.   MRN: [id number] Chief Complaint  Patient presents with   New Patient (Initial Visit)    Ref Hande consult abnormal ABI    Erica Castillo is a 72 year old female who presents today due to abnormal ABIs.  Initially she had abnormal ABIs done by her home health screening agency.  These were subsequently repeated by her primary care provider and they remain abnormal.  She has an ABI 1.03 on the right and 0.83 on the left.  She has biphasic tibial artery waveforms bilaterally.  The studies were done at an outside facility.  Additionally she has a large wound on her right shin.  She notes that it has been healing but it has been slow to heal.  Is been present for a month or so.  It can be painful at times but currently she denies any purulent drainage or foul-smelling odors.  She denies any claudication-like symptoms.  She denies any rest pain like symptoms.    Review of Systems  Musculoskeletal:  Positive for gait problem.  Skin:  Positive for wound.  All other systems reviewed and are negative.      Objective:   Physical Exam Vitals reviewed.  Cardiovascular:     Rate and Rhythm: Normal rate.     Pulses:          Dorsalis pedis pulses are detected w/ Doppler on the right side and detected w/ Doppler on the left side.  Pulmonary:     Effort: Pulmonary effort is normal.  Skin:    General: Skin is warm and dry.  Neurological:     Mental Status: She is alert and oriented to person, place, and time.  Psychiatric:        Mood and Affect: Mood normal.        Behavior: Behavior normal.        Thought Content: Thought content normal.        Judgment: Judgment normal.     BP (!) 143/73 (BP Location: Right Arm)   Pulse 98   Resp 16   Wt 142 lb 6.4 oz (64.6 kg)   BMI 26.05 kg/m   Past Medical History:  Diagnosis Date   Anemia    HX of   Arthritis    Back problem    COVID-19    12/20   Diabetes  mellitus without complication (HCC)    Gout    History of seizures    under control x 10 years   Hypercholesterolemia    Hypertension    Indigestion    Vertigo    in past    Social History   Socioeconomic History   Marital status: Divorced    Spouse name: Not on file   Number of children: Not on file   Years of education: Not on file   Highest education level: Not on file  Occupational History   Not on file  Tobacco Use   Smoking status: Never   Smokeless tobacco: Never  Vaping Use   Vaping status: Never Used  Substance and Sexual Activity   Alcohol use: No   Drug use: No   Sexual activity: Not on file  Other Topics Concern   Not on file  Social History Narrative   Not on file   Social Determinants of Health   Financial Resource Strain: Low Risk  (01/03/2023)   Received from [facility]   Overall Financial Resource Strain (CARDIA)    Difficulty of Paying Living Expenses: Not hard at all  Food Insecurity: No Food Insecurity (01/03/2023)   Received from [facility]   Hunger Vital Sign    Worried About Running Out of Food in the Last Year: Never true    Ran Out of Food in the Last Year: Never true  Transportation Needs: No Transportation Needs (01/03/2023)   Received from [facility] - Transportation    In the past 12 months, has lack of transportation kept you from medical appointments or from getting medications?: No    Lack of Transportation (Non-Medical): No  Physical Activity: Not on file  Stress: Not on file  Social Connections: Not on file  Intimate Partner Violence: Not on file    Past Surgical History:  Procedure Laterality Date   ABDOMINAL HYSTERECTOMY     CATARACT EXTRACTION W/PHACO Right 09/09/2019   Procedure: CATARACT EXTRACTION PHACO AND INTRAOCULAR LENS PLACEMENT (IOC) RIGHT DIABETIC 3.85  00:31.3;  Surgeon: Nevada Crane, MD;  Location: [facility];  Service:  Ophthalmology;  Laterality: Right;  Diabetic   CATARACT EXTRACTION W/PHACO Left 09/30/2019   Procedure: CATARACT EXTRACTION PHACO AND INTRAOCULAR LENS PLACEMENT (IOC) LEFT DIABETIC 2.69  00:28.7;  Surgeon: Nevada Crane, MD;  Location: [facility];  Service: Ophthalmology;  Laterality: Left;  Diabetic - oral meds   CHOLECYSTECTOMY     COLONOSCOPY     COLONOSCOPY WITH PROPOFOL N/A 04/06/2021   Procedure: COLONOSCOPY WITH PROPOFOL;  Surgeon: Regis Bill, MD;  Location: [facility] END[facility];  Service: Endoscopy;  Laterality: N/A;  DM   ESOPHAGOGASTRODUODENOSCOPY (EGD) WITH PROPOFOL N/A 04/06/2021   Procedure: ESOPHAGOGASTRODUODENOSCOPY (EGD) WITH PROPOFOL;  Surgeon: Regis Bill, MD;  Location: [facility] ENDOSCOPY;  Service: Endoscopy;  Laterality: N/A;   INCONTINENCE SURGERY     bladder suspension    Family History  Problem Relation Age of Onset   Diabetes Mother    Hypertension Mother    Hypertension Father    Breast cancer Niece     No Known Allergies     Latest Ref Rng & Units 09/21/2022    1:22 PM 03/15/2022    2:31 PM 08/18/2016    3:15 PM  CBC  WBC 4.0 - 10.5 K/uL 11.3  13.6  12.5   Hemoglobin 12.0 - 15.0 g/dL 54.2  70.6  23.7   Hematocrit 36.0 - 46.0 % 41.2  41.1  35.8   Platelets 150 - 400 K/uL 369  366  338       CMP     Component Value Date/Time   NA 139 01/24/2012 1213   K 4.1 01/24/2012 1213   CL 104 01/24/2012 1213   CO2 27 01/24/2012 1213   GLUCOSE 129 (H) 01/24/2012 1213   BUN 10 01/24/2012 1213   CREATININE 0.85 01/24/2012 1213   CALCIUM 9.0 01/24/2012 1213   PROT 7.6 01/24/2012 1213   ALBUMIN 4.1 01/24/2012 1213   AST 35 01/24/2012 1213   ALT 29 01/24/2012 1213   ALKPHOS 109 01/24/2012 1213   BILITOT 0.8 01/24/2012 1213   GFRNONAA >60 01/24/2012 1213     No results found.     Assessment & Plan:   1. PAD (peripheral artery disease) (HCC) Today had a long discussion with the patient regarding her study results and what that means  in terms of wound healing.  Based on the studies she theoretically should have  adequate perfusion for wound healing of her right lower extremity.  She certainly may have other issues conflicting her healing such as her elevated A1c.  I think that her wound would certainly do better with healing if she were to seek professional wound care at the wound care clinic.  However at this time she does not wish to do so.  She will continue with her PCP's wound recommendations.  We will continue to keep a close eye on wound healing and have her return in 6 weeks with noninvasive studies.  2. Essential hypertension Continue antihypertensive medications as already ordered, these medications have been reviewed and there are no changes at this time.  3. Type 2 diabetes mellitus without complication, without long-term current use of insulin (HCC) Continue hypoglycemic medications as already ordered, these medications have been reviewed and there are no changes at this time.  Hgb A1C to be monitored as already arranged by primary service   Current Outpatient Medications on File Prior to Visit  Medication Sig Dispense Refill   acetaminophen (TYLENOL) 500 MG tablet Take 500 mg by mouth every 6 (six) hours as needed.     amLODipine (NORVASC) 5 MG tablet Take 5 mg by mouth daily.     aspirin EC 81 MG tablet Take 81 mg by mouth daily.     benazepril (LOTENSIN) 40 MG tablet Take 40 mg by mouth daily.     clotrimazole (LOTRIMIN) 1 % cream Apply 1 Application topically as needed. On foot.     dapagliflozin propanediol (FARXIGA) 5 MG TABS tablet Take 1 tablet by mouth every morning     glipiZIDE (GLUCOTROL) 5 MG tablet 5 mg 2 (two) times daily before a meal.   5   Insulin Glargine w/ Trans Port 100 UNIT/ML SOPN Inject 10 Units into the skin at bedtime.     metoprolol succinate (TOPROL-XL) 25 MG 24 hr tablet 50 mg daily.      omeprazole (PRILOSEC) 20 MG capsule TAKE 1 CAPSULE(20 MG) BY MOUTH EVERY DAY     oxybutynin  (DITROPAN) 5 MG tablet Take 5 mg by mouth 2 (two) times daily.   5   simvastatin (ZOCOR) 20 MG tablet TAKE 1 TABLET BY MOUTH NIGHTLY.     amLODipine (NORVASC) 10 MG tablet Take 1 tablet by mouth daily. (Patient not taking: Reported on 01/07/2022)     CINNAMON PO Take by mouth daily. (Patient not taking: Reported on 01/07/2022)     colchicine 0.6 MG tablet Take 0.6 mg by mouth daily as needed. (Patient not taking: Reported on 01/07/2022)     Dulaglutide (TRULICITY) 0.75 MG/0.5ML SOPN Inject into the skin. (Patient not taking: Reported on 03/22/2022)     levETIRAcetam (KEPPRA) 500 MG tablet Take 500 mg by mouth 2 (two) times daily.  (Patient not taking: Reported on 02/06/2023)     meloxicam (MOBIC) 7.5 MG tablet Take 7.5 mg by mouth daily.  (Patient not taking: Reported on 01/07/2022)  5   traMADol (ULTRAM) 50 MG tablet 1/2-1 po bid prn (Patient not taking: Reported on 01/07/2022)     No current facility-administered medications on file prior to visit.    There are no Patient Instructions on file for this visit. No follow-ups on file.   Georgiana Spinner, NP

## 2023-02-07 ENCOUNTER — Encounter (INDEPENDENT_AMBULATORY_CARE_PROVIDER_SITE_OTHER): Payer: Self-pay | Admitting: Nurse Practitioner

## 2023-03-20 ENCOUNTER — Ambulatory Visit (INDEPENDENT_AMBULATORY_CARE_PROVIDER_SITE_OTHER): Payer: Medicare Other

## 2023-03-20 ENCOUNTER — Encounter (INDEPENDENT_AMBULATORY_CARE_PROVIDER_SITE_OTHER): Payer: Self-pay | Admitting: Vascular Surgery

## 2023-03-20 ENCOUNTER — Ambulatory Visit (INDEPENDENT_AMBULATORY_CARE_PROVIDER_SITE_OTHER): Payer: Medicare Other | Admitting: Vascular Surgery

## 2023-03-20 ENCOUNTER — Other Ambulatory Visit (INDEPENDENT_AMBULATORY_CARE_PROVIDER_SITE_OTHER): Payer: Self-pay | Admitting: Nurse Practitioner

## 2023-03-20 VITALS — BP 131/75 | HR 86 | Resp 18 | Ht 63.0 in | Wt 142.4 lb

## 2023-03-20 DIAGNOSIS — I10 Essential (primary) hypertension: Secondary | ICD-10-CM

## 2023-03-20 DIAGNOSIS — I739 Peripheral vascular disease, unspecified: Secondary | ICD-10-CM

## 2023-03-20 DIAGNOSIS — E119 Type 2 diabetes mellitus without complications: Secondary | ICD-10-CM | POA: Diagnosis not present

## 2023-03-20 DIAGNOSIS — M51369 Other intervertebral disc degeneration, lumbar region without mention of lumbar back pain or lower extremity pain: Secondary | ICD-10-CM | POA: Diagnosis not present

## 2023-03-20 DIAGNOSIS — I70223 Atherosclerosis of native arteries of extremities with rest pain, bilateral legs: Secondary | ICD-10-CM

## 2023-03-20 LAB — VAS US ABI WITH/WO TBI
Left ABI: 0.75
Right ABI: 0.99

## 2023-03-21 ENCOUNTER — Inpatient Hospital Stay (HOSPITAL_BASED_OUTPATIENT_CLINIC_OR_DEPARTMENT_OTHER): Payer: Medicare Other | Admitting: Oncology

## 2023-03-21 ENCOUNTER — Encounter: Payer: Self-pay | Admitting: Oncology

## 2023-03-21 ENCOUNTER — Inpatient Hospital Stay: Payer: Medicare Other | Attending: Oncology

## 2023-03-21 VITALS — BP 132/66 | HR 72 | Temp 97.1°F | Resp 18 | Wt 142.0 lb

## 2023-03-21 DIAGNOSIS — D519 Vitamin B12 deficiency anemia, unspecified: Secondary | ICD-10-CM | POA: Insufficient documentation

## 2023-03-21 DIAGNOSIS — E538 Deficiency of other specified B group vitamins: Secondary | ICD-10-CM

## 2023-03-21 DIAGNOSIS — D509 Iron deficiency anemia, unspecified: Secondary | ICD-10-CM

## 2023-03-21 LAB — CBC WITH DIFFERENTIAL/PLATELET
Abs Immature Granulocytes: 0.05 10*3/uL (ref 0.00–0.07)
Basophils Absolute: 0.1 10*3/uL (ref 0.0–0.1)
Basophils Relative: 1 %
Eosinophils Absolute: 0.2 10*3/uL (ref 0.0–0.5)
Eosinophils Relative: 2 %
HCT: 39.9 % (ref 36.0–46.0)
Hemoglobin: 12.9 g/dL (ref 12.0–15.0)
Immature Granulocytes: 1 %
Lymphocytes Relative: 25 %
Lymphs Abs: 2.7 10*3/uL (ref 0.7–4.0)
MCH: 30.1 pg (ref 26.0–34.0)
MCHC: 32.3 g/dL (ref 30.0–36.0)
MCV: 93 fL (ref 80.0–100.0)
Monocytes Absolute: 0.6 10*3/uL (ref 0.1–1.0)
Monocytes Relative: 6 %
Neutro Abs: 7.2 10*3/uL (ref 1.7–7.7)
Neutrophils Relative %: 65 %
Platelets: 363 10*3/uL (ref 150–400)
RBC: 4.29 MIL/uL (ref 3.87–5.11)
RDW: 13.2 % (ref 11.5–15.5)
WBC: 10.9 10*3/uL — ABNORMAL HIGH (ref 4.0–10.5)
nRBC: 0 % (ref 0.0–0.2)

## 2023-03-21 LAB — IRON AND TIBC
Iron: 65 ug/dL (ref 28–170)
Saturation Ratios: 23 % (ref 10.4–31.8)
TIBC: 284 ug/dL (ref 250–450)
UIBC: 219 ug/dL

## 2023-03-21 LAB — FERRITIN: Ferritin: 24 ng/mL (ref 11–307)

## 2023-03-21 LAB — VITAMIN B12: Vitamin B-12: 209 pg/mL (ref 180–914)

## 2023-03-21 NOTE — Progress Notes (Signed)
Hematology/Oncology Consult note [facility]  [phone] [phone]  Patient Care Team: Barbette Reichmann, MD as PCP - General (Internal Medicine) Creig Hines, MD as Consulting Physician (Oncology)   Name of the patient: Erica Castillo  [id number]  Mar 04, 1951   Date of visit: 03/21/23  Diagnosis-   Iron and B12 deficiency anemia  Chief complaint/ Reason for visit-routine follow-up of iron and B12 deficiency anemia  Heme/Onc history: patient is a 72 year old female who was seen by me back in 2019 for leukocytosis which was thought to be reactive.  Peripheral flow cytometry did not reveal any immunophenotypic abnormality and BCR ABL testing was normal.  She has now been referred for anemia.  Most recent CBC shows a white cell count of 15.4, H&H of 11.7/38 with an MCV of 84 and a platelet count of 545.  Her white cell count has been between 11-12 at least dating back to 2015.  Also her platelet count has mostly been within normal limits but since May 2023 she was noted to have an elevated platelet count as well.  Hemoglobin has been mostly around 12 iron studies recently showed a ferritin level of 4 TSH was normal B12 levels were low at 199    Patient received 2 doses of Feraheme in October 2023 and 1 dose of B12 injection.  Interval history-she is doing well overall and denies any specific complaints at this time.  Denies any blood loss in her stool or urine.  Her blood sugars are still not well-controlled for which she is following up withEndocrinology and may need to start Ozempic soon.  She is concerned about potential weight loss associated with Ozempic  ECOG PS- 1 Pain scale- 0   Review of systems- Review of Systems  Constitutional:  Negative for chills, fever, malaise/fatigue and weight loss.  HENT:  Negative for congestion, ear discharge and nosebleeds.   Eyes:  Negative for blurred vision.  Respiratory:  Negative for cough,  hemoptysis, sputum production, shortness of breath and wheezing.   Cardiovascular:  Negative for chest pain, palpitations, orthopnea and claudication.  Gastrointestinal:  Negative for abdominal pain, blood in stool, constipation, diarrhea, heartburn, melena, nausea and vomiting.  Genitourinary:  Negative for dysuria, flank pain, frequency, hematuria and urgency.  Musculoskeletal:  Negative for back pain, joint pain and myalgias.  Skin:  Negative for rash.  Neurological:  Negative for dizziness, tingling, focal weakness, seizures, weakness and headaches.  Endo/Heme/Allergies:  Does not bruise/bleed easily.  Psychiatric/Behavioral:  Negative for depression and suicidal ideas. The patient does not have insomnia.       No Known Allergies   Past Medical History:  Diagnosis Date   Anemia    HX of   Arthritis    Back problem    COVID-19    12/20   Diabetes mellitus without complication (HCC)    Gout    History of seizures    under control x 10 years   Hypercholesterolemia    Hypertension    Indigestion    Vertigo    in past     Past Surgical History:  Procedure Laterality Date   ABDOMINAL HYSTERECTOMY     CATARACT EXTRACTION W/PHACO Right 09/09/2019   Procedure: CATARACT EXTRACTION PHACO AND INTRAOCULAR LENS PLACEMENT (IOC) RIGHT DIABETIC 3.85  00:31.3;  Surgeon: Nevada Crane, MD;  Location: [facility];  Service: Ophthalmology;  Laterality: Right;  Diabetic   CATARACT EXTRACTION W/PHACO Left 09/30/2019   Procedure: CATARACT EXTRACTION  PHACO AND INTRAOCULAR LENS PLACEMENT (IOC) LEFT DIABETIC 2.69  00:28.7;  Surgeon: Nevada Crane, MD;  Location: [facility];  Service: Ophthalmology;  Laterality: Left;  Diabetic - oral meds   CHOLECYSTECTOMY     COLONOSCOPY     COLONOSCOPY WITH PROPOFOL N/A 04/06/2021   Procedure: COLONOSCOPY WITH PROPOFOL;  Surgeon: Regis Bill, MD;  Location: [facility];  Service: Endoscopy;  Laterality: N/A;  DM    ESOPHAGOGASTRODUODENOSCOPY (EGD) WITH PROPOFOL N/A 04/06/2021   Procedure: ESOPHAGOGASTRODUODENOSCOPY (EGD) WITH PROPOFOL;  Surgeon: Regis Bill, MD;  Location: [facility];  Service: Endoscopy;  Laterality: N/A;   INCONTINENCE SURGERY     bladder suspension    Social History   Socioeconomic History   Marital status: Divorced    Spouse name: Not on file   Number of children: Not on file   Years of education: Not on file   Highest education level: Not on file  Occupational History   Not on file  Tobacco Use   Smoking status: Never   Smokeless tobacco: Never  Vaping Use   Vaping status: Never Used  Substance and Sexual Activity   Alcohol use: No   Drug use: No   Sexual activity: Not on file  Other Topics Concern   Not on file  Social History Narrative   Not on file   Social Drivers of Health   Financial Resource Strain: Low Risk  (01/03/2023)   Received from [facility]   Overall Financial Resource Strain (CARDIA)    Difficulty of Paying Living Expenses: Not hard at all  Food Insecurity: No Food Insecurity (01/03/2023)   Received from [facility]   Hunger Vital Sign    Worried About Running Out of Food in the Last Year: Never true    Ran Out of Food in the Last Year: Never true  Transportation Needs: No Transportation Needs (01/03/2023)   Received from [facility] - Transportation    In the past 12 months, has lack of transportation kept you from medical appointments or from getting medications?: No    Lack of Transportation (Non-Medical): No  Physical Activity: Not on file  Stress: Not on file  Social Connections: Not on file  Intimate Partner Violence: Not on file    Family History  Problem Relation Age of Onset   Diabetes Mother    Hypertension Mother    Hypertension Father    Breast cancer Niece      Current Outpatient Medications:    acetaminophen (TYLENOL) 500 MG tablet, Take 500 mg  by mouth every 6 (six) hours as needed., Disp: , Rfl:    amLODipine (NORVASC) 10 MG tablet, Take 1 tablet by mouth daily., Disp: , Rfl:    amLODipine (NORVASC) 5 MG tablet, Take 5 mg by mouth daily., Disp: , Rfl:    aspirin EC 81 MG tablet, Take 81 mg by mouth daily., Disp: , Rfl:    BD PEN NEEDLE NANO 2ND GEN 32G X 4 MM MISC, See admin instructions., Disp: , Rfl:    benazepril (LOTENSIN) 40 MG tablet, Take 40 mg by mouth daily., Disp: , Rfl:    CINNAMON PO, Take by mouth daily. (Patient not taking: Reported on 03/20/2023), Disp: , Rfl:    clotrimazole (LOTRIMIN) 1 % cream, Apply 1 Application topically as needed. On foot., Disp: , Rfl:    colchicine 0.6 MG tablet, Take 0.6 mg by mouth as needed., Disp: , Rfl:  dapagliflozin propanediol (FARXIGA) 5 MG TABS tablet, Take 1 tablet by mouth every morning, Disp: , Rfl:    Dulaglutide (TRULICITY) 0.75 MG/0.5ML SOPN, Inject into the skin. (Patient not taking: Reported on 03/22/2022), Disp: , Rfl:    glipiZIDE (GLUCOTROL) 5 MG tablet, 5 mg 2 (two) times daily before a meal. , Disp: , Rfl: 5   Insulin Glargine w/ Trans Port 100 UNIT/ML SOPN, Inject 10 Units into the skin at bedtime., Disp: , Rfl:    levETIRAcetam (KEPPRA) 500 MG tablet, Take 500 mg by mouth 2 (two) times daily.  (Patient not taking: Reported on 02/06/2023), Disp: , Rfl:    meloxicam (MOBIC) 7.5 MG tablet, Take 7.5 mg by mouth daily.  (Patient not taking: Reported on 03/20/2023), Disp: , Rfl: 5   metoprolol succinate (TOPROL-XL) 25 MG 24 hr tablet, 50 mg daily. , Disp: , Rfl:    omeprazole (PRILOSEC) 20 MG capsule, TAKE 1 CAPSULE(20 MG) BY MOUTH EVERY DAY, Disp: , Rfl:    oxybutynin (DITROPAN) 5 MG tablet, Take 5 mg by mouth 2 (two) times daily. , Disp: , Rfl: 5   simvastatin (ZOCOR) 20 MG tablet, TAKE 1 TABLET BY MOUTH NIGHTLY., Disp: , Rfl:    traMADol (ULTRAM) 50 MG tablet, as needed for moderate pain (pain score 4-6)., Disp: , Rfl:   Physical exam:  Vitals:   03/21/23 1418  BP:  132/66  Pulse: 72  Resp: 18  Temp: (!) 97.1 F (36.2 C)  TempSrc: Tympanic  SpO2: 100%  Weight: 142 lb (64.4 kg)   Physical Exam Cardiovascular:     Rate and Rhythm: Normal rate and regular rhythm.     Heart sounds: Normal heart sounds.  Pulmonary:     Effort: Pulmonary effort is normal.  Skin:    General: Skin is warm and dry.  Neurological:     Mental Status: She is alert and oriented to person, place, and time.         Latest Ref Rng & Units 01/24/2012   12:13 PM  CMP  Glucose 65 - 99 mg/dL 295   BUN 7 - 18 mg/dL 10   Creatinine 1.88 - 1.30 mg/dL 4.16   Sodium 606 - 301 mmol/L 139   Potassium 3.5 - 5.1 mmol/L 4.1   Chloride 98 - 107 mmol/L 104   CO2 21 - 32 mmol/L 27   Calcium 8.5 - 10.1 mg/dL 9.0   Total Protein 6.4 - 8.2 g/dL 7.6   Total Bilirubin 0.2 - 1.0 mg/dL 0.8   Alkaline Phos 50 - 136 Unit/L 109   AST 15 - 37 Unit/L 35   ALT 12 - 78 U/L 29       Latest Ref Rng & Units 03/21/2023    1:22 PM  CBC  WBC 4.0 - 10.5 K/uL 10.9   Hemoglobin 12.0 - 15.0 g/dL 60.1   Hematocrit 09.3 - 46.0 % 39.9   Platelets 150 - 400 K/uL 363     No images are attached to the encounter.  VAS [place] LOWER EXTREMITY ARTERIAL DUPLEX Result Date: 03/20/2023 LOWER EXTREMITY ARTERIAL DUPLEX STUDY Patient Name:  REDITH MACIA  Date of Exam:   03/20/2023 Medical Rec #: [id number]       Accession #:    [id number] Date of Birth: 06-Apr-1950       Patient Gender: F Patient Age:   66 years Exam [facility] Procedure:      VAS [place] LOWER EXTREMITY ARTERIAL DUPLEX  Referring Phys: Sheppard Plumber --------------------------------------------------------------------------------  Indications: Claudication, ulceration, and peripheral artery disease. High Risk Factors: Diabetes, no history of smoking.  Current ABI: Right: 0.99, Left: 0.75 Performing Technologist: Hardie Lora RVT  Examination Guidelines: A complete evaluation includes B-mode imaging, spectral Doppler, color Doppler,  and power Doppler as needed of all accessible portions of each vessel. Bilateral testing is considered an integral part of a complete examination. Limited examinations for reoccurring indications may be performed as noted.   +----------+--------+-----+---------------+----------+--------+ LEFT      PSV cm/sRatioStenosis       Waveform  Comments +----------+--------+-----+---------------+----------+--------+ CFA Distal114                         biphasic           +----------+--------+-----+---------------+----------+--------+ DFA       74                          biphasic           +----------+--------+-----+---------------+----------+--------+ SFA Prox  53                          biphasic           +----------+--------+-----+---------------+----------+--------+ SFA Mid   464          75-99% stenosisbiphasic           +----------+--------+-----+---------------+----------+--------+ SFA Distal81                          monophasic         +----------+--------+-----+---------------+----------+--------+ POP Prox  136                         monophasic         +----------+--------+-----+---------------+----------+--------+ POP Distal87                          monophasic         +----------+--------+-----+---------------+----------+--------+ ATA Distal39                          monophasic         +----------+--------+-----+---------------+----------+--------+ PTA Distal63                          monophasic         +----------+--------+-----+---------------+----------+--------+ A focal velocity elevation of 464 cm/s was obtained at [facility] SFA with a VR of 12.1. Findings are characteristic of 75-99% stenosis.  Summary: Left: 75-99% stenosis noted in the superficial femoral artery.  See table(s) above for measurements and observations. Electronically signed by Levora Dredge MD on 03/20/2023 at 4:28:06 PM.    Final    VAS [place] ABI WITH/WO TBI Result  Date: 03/20/2023  LOWER EXTREMITY DOPPLER STUDY Patient Name:  MOTUNRAYO SEAWELL  Date of Exam:   03/20/2023 Medical Rec #: [id number]       Accession #:    [id number] Date of Birth: 22-Oct-1950       Patient Gender: F Patient Age:   33 years Exam Location:   [facility] Procedure:      VAS [place] ABI WITH/WO TBI Referring Phys: Sheppard Plumber --------------------------------------------------------------------------------  Indications: Claudication, ulceration, and peripheral artery disease. High Risk Factors: Hypertension, Diabetes.  Performing Technologist: Molli Hazard  Cravey RVT  Examination Guidelines: A complete evaluation includes at minimum, Doppler waveform signals and systolic blood pressure reading at the level of bilateral brachial, anterior tibial, and posterior tibial arteries, when vessel segments are accessible. Bilateral testing is considered an integral part of a complete examination. Photoelectric Plethysmograph (PPG) waveforms and toe systolic pressure readings are included as required and additional duplex testing as needed. Limited examinations for reoccurring indications may be performed as noted.  ABI Findings: +---------+------------------+-----+---------+--------+ Right    Rt Pressure (mmHg)IndexWaveform Comment  +---------+------------------+-----+---------+--------+ Brachial 133                                      +---------+------------------+-----+---------+--------+ PTA      137               0.99 triphasic         +---------+------------------+-----+---------+--------+ DP       131               0.95 biphasic          +---------+------------------+-----+---------+--------+ Great Toe105               0.76                   +---------+------------------+-----+---------+--------+ +---------+------------------+-----+----------+-------+ Left     Lt Pressure (mmHg)IndexWaveform  Comment +---------+------------------+-----+----------+-------+ Brachial 138                                       +---------+------------------+-----+----------+-------+ PTA      103               0.75 triphasic         +---------+------------------+-----+----------+-------+ DP       89                0.64 monophasic        +---------+------------------+-----+----------+-------+ Great Toe68                0.49                   +---------+------------------+-----+----------+-------+ +-------+-----------+-----------+------------+------------+ ABI/TBIToday's ABIToday's TBIPrevious ABIPrevious TBI +-------+-----------+-----------+------------+------------+ Right  0.99       0.76                                +-------+-----------+-----------+------------+------------+ Left   0.75       0.49                                +-------+-----------+-----------+------------+------------+  Summary: Right: Resting right ankle-brachial index is within normal range. The right toe-brachial index is normal. Left: Resting left ankle-brachial index indicates moderate left lower extremity arterial disease. The left toe-brachial index is abnormal. *See table(s) above for measurements and observations.  Electronically signed by Levora Dredge MD on 03/20/2023 at 4:27:59 PM.    Final      Assessment and plan- Patient is a 72 y.o. female here for routine folLow up of iron and B12 deficiency anemia  Patient has been receiving B12 injections through her primary care doctor [employer] every 3 months.  She has not required IV iron for over a year now.  CBC shows stable hemoglobin between 12-13.  Ferritin levels  are low at 24 which came back after the patient left the clinic.  Iron studies are otherwise normal.  She can take oral iron if she can tolerate it or if she is interested we can set her up for IV iron infusions at this time.  She would like to follow-up with her primary care doctor at this time.  She can be referred to [place] in the future if questions or concerns arise    Visit Diagnosis 1. Iron deficiency anemia, unspecified iron deficiency anemia type      Dr. Owens Shark, MD, MPH [facility] [phone] 03/21/2023 5:23 PM

## 2023-03-23 ENCOUNTER — Telehealth: Payer: Self-pay | Admitting: *Deleted

## 2023-03-23 NOTE — Telephone Encounter (Signed)
Called pt about ferritin was low and wondered if she want to get IV iron. Asked about b12 shots. Pt says with PCP Dr. Marcello Fennel 2nd week of Jan.and she gets b12 inj that day. She would like him to look at the numbers and then she will call us with the answer if she want IV iron or not. She has my number to call back at [phone]

## 2023-04-02 ENCOUNTER — Encounter (INDEPENDENT_AMBULATORY_CARE_PROVIDER_SITE_OTHER): Payer: Self-pay | Admitting: Vascular Surgery

## 2023-04-02 DIAGNOSIS — I70229 Atherosclerosis of native arteries of extremities with rest pain, unspecified extremity: Secondary | ICD-10-CM | POA: Insufficient documentation

## 2023-04-02 NOTE — Progress Notes (Signed)
MRN : [id number]  Erica Castillo is a 72 y.o. (1951/02/03) female who presents with chief complaint of check circulation.  History of Present Illness:   The patient returns to the office for followup of atherosclerotic changes of the lower extremities and review of the noninvasive studies.   The patient notes that there has been a significant deterioration in the lower extremity symptoms.  The patient notes interval shortening of their claudication distance and development of mild rest pain symptoms. No new ulcers or wounds have occurred since the last visit.  There have been no significant changes to the patient's overall health care.  The patient denies amaurosis fugax or recent TIA symptoms. There are no recent neurological changes noted. There is no history of DVT, PE or superficial thrombophlebitis. The patient denies recent episodes of angina or shortness of breath.   ABI's Rt=0.99 and Lt=0.75   Current Meds  Medication Sig   acetaminophen (TYLENOL) 500 MG tablet Take 500 mg by mouth every 6 (six) hours as needed.   amLODipine (NORVASC) 10 MG tablet Take 1 tablet by mouth daily.   amLODipine (NORVASC) 5 MG tablet Take 5 mg by mouth daily.   aspirin EC 81 MG tablet Take 81 mg by mouth daily.   BD PEN NEEDLE NANO 2ND GEN 32G X 4 MM MISC See admin instructions.   benazepril (LOTENSIN) 40 MG tablet Take 40 mg by mouth daily.   clotrimazole (LOTRIMIN) 1 % cream Apply 1 Application topically as needed. On foot.   colchicine 0.6 MG tablet Take 0.6 mg by mouth as needed.   dapagliflozin propanediol (FARXIGA) 5 MG TABS tablet Take 1 tablet by mouth every morning   glipiZIDE (GLUCOTROL) 5 MG tablet 5 mg 2 (two) times daily before a meal.    Insulin Glargine w/ Trans Port 100 UNIT/ML SOPN Inject 10 Units into the skin at bedtime.   metoprolol succinate (TOPROL-XL) 25 MG 24 hr tablet 50 mg daily.    omeprazole  (PRILOSEC) 20 MG capsule TAKE 1 CAPSULE(20 MG) BY MOUTH EVERY DAY   oxybutynin (DITROPAN) 5 MG tablet Take 5 mg by mouth 2 (two) times daily.    simvastatin (ZOCOR) 20 MG tablet TAKE 1 TABLET BY MOUTH NIGHTLY.   traMADol (ULTRAM) 50 MG tablet as needed for moderate pain (pain score 4-6).    Past Medical History:  Diagnosis Date   Anemia    HX of   Arthritis    Back problem    COVID-19    12/20   Diabetes mellitus without complication (HCC)    Gout    History of seizures    under control x 10 years   Hypercholesterolemia    Hypertension    Indigestion    Vertigo    in past    Past Surgical History:  Procedure Laterality Date   ABDOMINAL HYSTERECTOMY     CATARACT EXTRACTION W/PHACO Right 09/09/2019   Procedure: CATARACT EXTRACTION PHACO AND INTRAOCULAR LENS PLACEMENT (IOC) RIGHT DIABETIC 3.85  00:31.3;  Surgeon: Nevada Crane, MD;  Location: [facility];  Service: Ophthalmology;  Laterality:  Right;  Diabetic   CATARACT EXTRACTION W/PHACO Left 09/30/2019   Procedure: CATARACT EXTRACTION PHACO AND INTRAOCULAR LENS PLACEMENT (IOC) LEFT DIABETIC 2.69  00:28.7;  Surgeon: Nevada Crane, MD;  Location: [facility];  Service: Ophthalmology;  Laterality: Left;  Diabetic - oral meds   CHOLECYSTECTOMY     COLONOSCOPY     COLONOSCOPY WITH PROPOFOL N/A 04/06/2021   Procedure: COLONOSCOPY WITH PROPOFOL;  Surgeon: Regis Bill, MD;  Location: [facility];  Service: Endoscopy;  Laterality: N/A;  DM   ESOPHAGOGASTRODUODENOSCOPY (EGD) WITH PROPOFOL N/A 04/06/2021   Procedure: ESOPHAGOGASTRODUODENOSCOPY (EGD) WITH PROPOFOL;  Surgeon: Regis Bill, MD;  Location: [facility];  Service: Endoscopy;  Laterality: N/A;   INCONTINENCE SURGERY     bladder suspension    Social History Social History   Tobacco Use   Smoking status: Never   Smokeless tobacco: Never  Vaping Use   Vaping status: Never Used  Substance Use Topics   Alcohol use: No   Drug use: No     Family History Family History  Problem Relation Age of Onset   Diabetes Mother    Hypertension Mother    Hypertension Father    Breast cancer Niece     No Known Allergies   REVIEW OF SYSTEMS (Negative unless checked)  Constitutional: [] Weight loss  [] Fever  [] Chills Cardiac: [] Chest pain   [] Chest pressure   [] Palpitations   [] Shortness of breath when laying flat   [] Shortness of breath with exertion. Vascular:  [x] Pain in legs with walking   [] Pain in legs at rest  [] History of DVT   [] Phlebitis   [] Swelling in legs   [] Varicose veins   [] Non-healing ulcers Pulmonary:   [] Uses home oxygen   [] Productive cough   [] Hemoptysis   [] Wheeze  [] COPD   [] Asthma Neurologic:  [] Dizziness   [] Seizures   [] History of stroke   [] History of TIA  [] Aphasia   [] Vissual changes   [] Weakness or numbness in arm   [] Weakness or numbness in leg Musculoskeletal:   [] Joint swelling   [] Joint pain   [] Low back pain Hematologic:  [] Easy bruising  [] Easy bleeding   [] Hypercoagulable state   [] Anemic Gastrointestinal:  [] Diarrhea   [] Vomiting  [] Gastroesophageal reflux/heartburn   [] Difficulty swallowing. Genitourinary:  [] Chronic kidney disease   [] Difficult urination  [] Frequent urination   [] Blood in urine Skin:  [] Rashes   [] Ulcers  Psychological:  [] History of anxiety   []  History of major depression.  Physical Examination  Vitals:   03/20/23 1536  BP: 131/75  Pulse: 86  Resp: 18  Weight: 142 lb 6.4 oz (64.6 kg)  Height: 5\' 3"  (1.6 m)   Body mass index is 25.23 kg/m. Gen: WD/WN, NAD Head: [place]/AT, No temporalis wasting.  Ear/Nose/Throat: Hearing grossly intact, nares w/o erythema or drainage Eyes: PER, EOMI, sclera nonicteric.  Neck: Supple, no masses.  No bruit or JVD.  Pulmonary:  Good air movement, no audible wheezing, no use of accessory muscles.  Cardiac: RRR, normal S1, S2, no Murmurs. Vascular:  mild trophic changes, no open wounds Vessel Right Left  Radial Palpable Palpable  PT  Not Palpable Not Palpable  DP Not Palpable Not Palpable  Gastrointestinal: soft, non-distended. No guarding/no peritoneal signs.  Musculoskeletal: M/S 5/5 throughout.  No visible deformity.  Neurologic: CN 2-12 intact. Pain and light touch intact in extremities.  Symmetrical.  Speech is fluent. Motor exam as listed above. Psychiatric: Judgment intact, Mood & affect appropriate for pt's clinical situation. Dermatologic: No  rashes or ulcers noted.  No changes consistent with cellulitis.   CBC Lab Results  Component Value Date   WBC 10.9 (H) 03/21/2023   HGB 12.9 03/21/2023   HCT 39.9 03/21/2023   MCV 93.0 03/21/2023   PLT 363 03/21/2023    BMET    Component Value Date/Time   NA 139 01/24/2012 1213   K 4.1 01/24/2012 1213   CL 104 01/24/2012 1213   CO2 27 01/24/2012 1213   GLUCOSE 129 (H) 01/24/2012 1213   BUN 10 01/24/2012 1213   CREATININE 0.85 01/24/2012 1213   CALCIUM 9.0 01/24/2012 1213   GFRNONAA >60 01/24/2012 1213   GFRAA >60 01/24/2012 1213   CrCl cannot be calculated (Patient's most recent lab result is older than the maximum 21 days allowed.).  COAG No results found for: "INR", "PROTIME"  Radiology VAS [place] LOWER EXTREMITY ARTERIAL DUPLEX Result Date: 03/20/2023 LOWER EXTREMITY ARTERIAL DUPLEX STUDY Patient Name:  AVEAH COURTOIS  Date of Exam:   03/20/2023 Medical Rec #: [id number]       Accession #:    [id number] Date of Birth: 12/15/1950       Patient Gender: F Patient Age:   25 years Exam Location:  [facility] Procedure:      VAS [place] LOWER EXTREMITY ARTERIAL DUPLEX Referring Phys: Sheppard Plumber --------------------------------------------------------------------------------  Indications: Claudication, ulceration, and peripheral artery disease. High Risk Factors: Diabetes, no history of smoking.  Current ABI: Right: 0.99, Left: 0.75 Performing Technologist: Hardie Lora RVT  Examination Guidelines: A complete evaluation includes B-mode imaging, spectral  Doppler, color Doppler, and power Doppler as needed of all accessible portions of each vessel. Bilateral testing is considered an integral part of a complete examination. Limited examinations for reoccurring indications may be performed as noted.   +----------+--------+-----+---------------+----------+--------+ LEFT      PSV cm/sRatioStenosis       Waveform  Comments +----------+--------+-----+---------------+----------+--------+ CFA Distal114                         biphasic           +----------+--------+-----+---------------+----------+--------+ DFA       74                          biphasic           +----------+--------+-----+---------------+----------+--------+ SFA Prox  53                          biphasic           +----------+--------+-----+---------------+----------+--------+ SFA Mid   464          75-99% stenosisbiphasic           +----------+--------+-----+---------------+----------+--------+ SFA Distal81                          monophasic         +----------+--------+-----+---------------+----------+--------+ POP Prox  136                         monophasic         +----------+--------+-----+---------------+----------+--------+ POP Distal87                          monophasic         +----------+--------+-----+---------------+----------+--------+ ATA Distal39  monophasic         +----------+--------+-----+---------------+----------+--------+ PTA Distal63                          monophasic         +----------+--------+-----+---------------+----------+--------+ A focal velocity elevation of 464 cm/s was obtained at [facility] SFA with a VR of 12.1. Findings are characteristic of 75-99% stenosis.  Summary: Left: 75-99% stenosis noted in the superficial femoral artery.  See table(s) above for measurements and observations. Electronically signed by Levora Dredge MD on 03/20/2023 at 4:28:06 PM.    Final    VAS [place] ABI  WITH/WO TBI Result Date: 03/20/2023  LOWER EXTREMITY DOPPLER STUDY Patient Name:  ARLESIA ZACCARIA  Date of Exam:   03/20/2023 Medical Rec #: [id number]       Accession #:    [id number] Date of Birth: [birth year]       Patient Gender: F Patient Age:   81 years Exam Location:  [facility] Procedure:      VAS [place] ABI WITH/WO TBI Referring Phys: Sheppard Plumber --------------------------------------------------------------------------------  Indications: Claudication, ulceration, and peripheral artery disease. High Risk Factors: Hypertension, Diabetes.  Performing Technologist: Hardie Lora RVT  Examination Guidelines: A complete evaluation includes at minimum, Doppler waveform signals and systolic blood pressure reading at the level of bilateral brachial, anterior tibial, and posterior tibial arteries, when vessel segments are accessible. Bilateral testing is considered an integral part of a complete examination. Photoelectric Plethysmograph (PPG) waveforms and toe systolic pressure readings are included as required and additional duplex testing as needed. Limited examinations for reoccurring indications may be performed as noted.  ABI Findings: +---------+------------------+-----+---------+--------+ Right    Rt Pressure (mmHg)IndexWaveform Comment  +---------+------------------+-----+---------+--------+ Brachial 133                                      +---------+------------------+-----+---------+--------+ PTA      137               0.99 triphasic         +---------+------------------+-----+---------+--------+ DP       131               0.95 biphasic          +---------+------------------+-----+---------+--------+ Great Toe105               0.76                   +---------+------------------+-----+---------+--------+ +---------+------------------+-----+----------+-------+ Left     Lt Pressure (mmHg)IndexWaveform  Comment  +---------+------------------+-----+----------+-------+ Brachial 138                                      +---------+------------------+-----+----------+-------+ PTA      103               0.75 triphasic         +---------+------------------+-----+----------+-------+ DP       89                0.64 monophasic        +---------+------------------+-----+----------+-------+ Great Toe68                0.49                   +---------+------------------+-----+----------+-------+ +-------+-----------+-----------+------------+------------+ ABI/TBIToday's ABIToday's TBIPrevious  ABIPrevious TBI +-------+-----------+-----------+------------+------------+ Right  0.99       0.76                                +-------+-----------+-----------+------------+------------+ Left   0.75       0.49                                +-------+-----------+-----------+------------+------------+  Summary: Right: Resting right ankle-brachial index is within normal range. The right toe-brachial index is normal. Left: Resting left ankle-brachial index indicates moderate left lower extremity arterial disease. The left toe-brachial index is abnormal. *See table(s) above for measurements and observations.  Electronically signed by Levora Dredge MD on 03/20/2023 at 4:27:59 PM.    Final      Assessment/Plan 1. Atherosclerosis of native artery of both lower extremities with rest pain (HCC) (Primary) Recommend:  The patient has evidence of severe atherosclerotic changes of both lower extremities with rest pain that is associated with preulcerative changes and impending tissue loss of the left foot.  This represents a limb threatening ischemia and places the patient at the risk for left limb loss.  Patient should undergo angiography of the left lower extremity with the hope for intervention for limb salvage.  The risks and benefits as well as the alternative therapies was discussed in detail with the  patient.  All questions were answered.  Patient wishes to consider this before agreeing to proceed with left lower extremity angiography.  She specifically requests that she will call when ready.  Given this fact I will plan for her to return in 3 months if she chooses not to proceed so that we do not lose track.   2. Essential hypertension Continue antihypertensive medications as already ordered, these medications have been reviewed and there are no changes at this time.  3. Type 2 diabetes mellitus without complication, without long-term current use of insulin (HCC) Continue hypoglycemic medications as already ordered, these medications have been reviewed and there are no changes at this time.  Hgb A1C to be monitored as already arranged by primary service  4. Degeneration of intervertebral disc of lumbar region, unspecified whether pain present Continue medications to treat the patient's degenerative disease as already ordered, these medications have been reviewed and there are no changes at this time.  Continued activity and therapy was stressed.    Levora Dredge, MD  04/02/2023 2:57 PM

## 2023-04-05 DIAGNOSIS — S81802A Unspecified open wound, left lower leg, initial encounter: Secondary | ICD-10-CM

## 2023-04-05 HISTORY — DX: Unspecified open wound, left lower leg, initial encounter: S81.802A

## 2023-06-11 ENCOUNTER — Other Ambulatory Visit: Payer: Self-pay

## 2023-06-11 ENCOUNTER — Encounter: Payer: Self-pay | Admitting: Emergency Medicine

## 2023-06-11 ENCOUNTER — Emergency Department
Admission: EM | Admit: 2023-06-11 | Discharge: 2023-06-11 | Disposition: A | Discharge status: Home / Self Care | Attending: Emergency Medicine | Admitting: Emergency Medicine

## 2023-06-11 DIAGNOSIS — T148XXA Other injury of unspecified body region, initial encounter: Secondary | ICD-10-CM

## 2023-06-11 DIAGNOSIS — X58XXXA Exposure to other specified factors, initial encounter: Secondary | ICD-10-CM | POA: Diagnosis not present

## 2023-06-11 DIAGNOSIS — E119 Type 2 diabetes mellitus without complications: Secondary | ICD-10-CM | POA: Insufficient documentation

## 2023-06-11 DIAGNOSIS — S81802A Unspecified open wound, left lower leg, initial encounter: Secondary | ICD-10-CM | POA: Diagnosis present

## 2023-06-11 DIAGNOSIS — D72829 Elevated white blood cell count, unspecified: Secondary | ICD-10-CM | POA: Diagnosis not present

## 2023-06-11 LAB — CBC WITH DIFFERENTIAL/PLATELET
Abs Immature Granulocytes: 0.05 10*3/uL (ref 0.00–0.07)
Basophils Absolute: 0.1 10*3/uL (ref 0.0–0.1)
Basophils Relative: 0 %
Eosinophils Absolute: 0.1 10*3/uL (ref 0.0–0.5)
Eosinophils Relative: 1 %
HCT: 34.6 % — ABNORMAL LOW (ref 36.0–46.0)
Hemoglobin: 11.1 g/dL — ABNORMAL LOW (ref 12.0–15.0)
Immature Granulocytes: 0 %
Lymphocytes Relative: 16 %
Lymphs Abs: 2.2 10*3/uL (ref 0.7–4.0)
MCH: 29.1 pg (ref 26.0–34.0)
MCHC: 32.1 g/dL (ref 30.0–36.0)
MCV: 90.8 fL (ref 80.0–100.0)
Monocytes Absolute: 0.9 10*3/uL (ref 0.1–1.0)
Monocytes Relative: 7 %
Neutro Abs: 10 10*3/uL — ABNORMAL HIGH (ref 1.7–7.7)
Neutrophils Relative %: 76 %
Platelets: 548 10*3/uL — ABNORMAL HIGH (ref 150–400)
RBC: 3.81 MIL/uL — ABNORMAL LOW (ref 3.87–5.11)
RDW: 13.4 % (ref 11.5–15.5)
WBC: 13.2 10*3/uL — ABNORMAL HIGH (ref 4.0–10.5)
nRBC: 0 % (ref 0.0–0.2)

## 2023-06-11 LAB — COMPREHENSIVE METABOLIC PANEL
ALT: 16 U/L (ref 0–44)
AST: 21 U/L (ref 15–41)
Albumin: 3.4 g/dL — ABNORMAL LOW (ref 3.5–5.0)
Alkaline Phosphatase: 67 U/L (ref 38–126)
Anion gap: 8 (ref 5–15)
BUN: 11 mg/dL (ref 8–23)
CO2: 27 mmol/L (ref 22–32)
Calcium: 8.6 mg/dL — ABNORMAL LOW (ref 8.9–10.3)
Chloride: 102 mmol/L (ref 98–111)
Creatinine, Ser: 0.7 mg/dL (ref 0.44–1.00)
GFR, Estimated: >60 mL/min (ref >60)
Glucose, Bld: 143 mg/dL — ABNORMAL HIGH (ref 70–99)
Potassium: 2.8 mmol/L — ABNORMAL LOW (ref 3.5–5.1)
Sodium: 137 mmol/L (ref 135–145)
Total Bilirubin: 0.6 mg/dL (ref 0.0–1.2)
Total Protein: 7.9 g/dL (ref 6.5–8.1)

## 2023-06-11 LAB — LACTIC ACID, PLASMA: Lactic Acid, Venous: 0.9 mmol/L (ref 0.5–1.9)

## 2023-06-11 MED ORDER — DOXYCYCLINE HYCLATE 100 MG PO CAPS: 100.0000 mg | ORAL_CAPSULE | Freq: Two times a day (BID) | ORAL | 0 refills | Status: AC

## 2023-06-11 MED ORDER — VANCOMYCIN HCL IN DEXTROSE 1-5 GM/200ML-% IV SOLN
1000.0000 mg | Freq: Once | INTRAVENOUS | Status: AC
  Administered 2023-06-11: 1000 mg via INTRAVENOUS
  Filled 2023-06-11: qty 200

## 2023-06-11 NOTE — ED Provider Notes (Signed)
 [facility] Provider Note    Event Date/Time   First MD Initiated Contact with Patient 06/11/23 1543     (approximate)   History   Wound Check   HPI  Erica Castillo is a 74 y.o. female who presents to the emergency department today from urgent care because of concerns for wound to left lower leg.  Patient states it has been present for a couple of months.  She has seen her primary care doctor for this a couple of times and has been put on 2 rounds of oral medication.  She did feel it was getting better until the pain is started again today.  She says she finished her last course of oral antibiotics about 2 weeks ago.  She denies any fevers, chills or fatigue.     Physical Exam   Triage Vital Signs: ED Triage Vitals  Encounter Vitals Group     BP 06/11/23 1358 (!) 147/80     Systolic BP Percentile --      Diastolic BP Percentile --      Pulse Rate 06/11/23 1358 96     Resp 06/11/23 1358 17     Temp 06/11/23 1358 98.4 F (36.9 C)     Temp Source 06/11/23 1358 Oral     SpO2 06/11/23 1358 100 %     Weight 06/11/23 1359 125 lb (56.7 kg)     Height 06/11/23 1359 5\' 4"  (1.626 m)     Head Circumference --      Peak Flow --      Pain Score 06/11/23 1359 0     Pain Loc --      Pain Education --      Exclude from Growth Chart --     Most recent vital signs: Vitals:   06/11/23 1358  BP: (!) 147/80  Pulse: 96  Resp: 17  Temp: 98.4 F (36.9 C)  SpO2: 100%   General: Awake, alert, oriented. CV:  Good peripheral perfusion. Regular rate. Resp:  Normal effort. Lungs clear. Abd:  No distention.  Other:  Wound to left lower leg on anterior shin. Very minimal discharge, no surrounding erythema appreciated.    ED Results / Procedures / Treatments   Labs (all labs ordered are listed, but only abnormal results are displayed) Labs Reviewed  COMPREHENSIVE METABOLIC PANEL - Abnormal; Notable for the following components:      Result Value   Potassium  2.8 (*)    Glucose, Bld 143 (*)    Calcium 8.6 (*)    Albumin 3.4 (*)    All other components within normal limits  CBC WITH DIFFERENTIAL/PLATELET - Abnormal; Notable for the following components:   WBC 13.2 (*)    RBC 3.81 (*)    Hemoglobin 11.1 (*)    HCT 34.6 (*)    Platelets 548 (*)    Neutro Abs 10.0 (*)    All other components within normal limits  LACTIC ACID, PLASMA  LACTIC ACID, PLASMA     EKG  None   RADIOLOGY None   PROCEDURES:  Critical Care performed: No    MEDICATIONS ORDERED IN ED: Medications - No data to display   IMPRESSION / MDM / ASSESSMENT AND PLAN / ED COURSE  I reviewed the triage vital signs and the nursing notes.  Differential diagnosis includes, but is not limited to, cellulitis, skin ulcer  Patient's presentation is most consistent with acute presentation with potential threat to life or bodily function.   Patient presented to the emergency department today because of concerns for skin wound.  On exam she does have a wound to her left anterior shin.  Very minimal discharge and no surrounding erythema.  Patient's blood work does show a slight leukocytosis although per chart review the patient tends to chronically have a slight elevation of her white blood cell count.  She denies any systemic signs of illness.  At this time I think would be reasonable to trial another course of oral antibiotics.  Will have her give dose of IV antibiotics here in the emergency department.  Additionally will refer patient to wound care clinic.      FINAL CLINICAL IMPRESSION(S) / ED DIAGNOSES   Final diagnoses:  Wound of skin     Note:  This document was prepared using Dragon voice recognition software and may include unintentional dictation errors.    Phineas Semen, MD 06/11/23 [id number]

## 2023-06-11 NOTE — ED Triage Notes (Signed)
 Patient to ED via POV for wound to left lower leg. Pt brought from [facility] due to infection needing IV antibiotics. Hx of diabetic wounds. Ongoing x1 week.

## 2023-06-16 ENCOUNTER — Other Ambulatory Visit (INDEPENDENT_AMBULATORY_CARE_PROVIDER_SITE_OTHER): Payer: Self-pay | Admitting: Vascular Surgery

## 2023-06-16 DIAGNOSIS — I70223 Atherosclerosis of native arteries of extremities with rest pain, bilateral legs: Secondary | ICD-10-CM

## 2023-06-18 DIAGNOSIS — I70219 Atherosclerosis of native arteries of extremities with intermittent claudication, unspecified extremity: Secondary | ICD-10-CM | POA: Insufficient documentation

## 2023-06-18 NOTE — Progress Notes (Unsigned)
 MRN : [id number]  Erica Castillo is a 73 y.o. (12-May-1950) female who presents with chief complaint of check circulation.  History of Present Illness:   The patient returns to the office for followup of atherosclerotic changes of the lower extremities and review of the noninvasive studies.    The patient notes that there has been a significant deterioration in the lower extremity symptoms.  The patient notes interval shortening of their claudication distance and development of mild rest pain symptoms. No new ulcers or wounds have occurred since the last visit.   There have been no significant changes to the patient's overall health care.   The patient denies amaurosis fugax or recent TIA symptoms. There are no recent neurological changes noted. There is no history of DVT, PE or superficial thrombophlebitis. The patient denies recent episodes of angina or shortness of breath.    ABI's Rt=1.07 and Lt=0.76 (previous ABI's Rt=0.99 and Lt=0.75 )  No outpatient medications have been marked as taking for the 06/19/23 encounter (Appointment) with Gilda Crease, Latina Craver, MD.    Past Medical History:  Diagnosis Date   Anemia    HX of   Arthritis    Back problem    COVID-19    12/20   Diabetes mellitus without complication (HCC)    Gout    History of seizures    under control x 10 years   Hypercholesterolemia    Hypertension    Indigestion    Vertigo    in past    Past Surgical History:  Procedure Laterality Date   ABDOMINAL HYSTERECTOMY     CATARACT EXTRACTION W/PHACO Right 09/09/2019   Procedure: CATARACT EXTRACTION PHACO AND INTRAOCULAR LENS PLACEMENT (IOC) RIGHT DIABETIC 3.85  00:31.3;  Surgeon: Nevada Crane, MD;  Location: [facility];  Service: Ophthalmology;  Laterality: Right;  Diabetic   CATARACT EXTRACTION W/PHACO Left 09/30/2019   Procedure: CATARACT EXTRACTION PHACO AND INTRAOCULAR LENS  PLACEMENT (IOC) LEFT DIABETIC 2.69  00:28.7;  Surgeon: Nevada Crane, MD;  Location: [facility];  Service: Ophthalmology;  Laterality: Left;  Diabetic - oral meds   CHOLECYSTECTOMY     COLONOSCOPY     COLONOSCOPY WITH PROPOFOL N/A 04/06/2021   Procedure: COLONOSCOPY WITH PROPOFOL;  Surgeon: Regis Bill, MD;  Location: [facility];  Service: Endoscopy;  Laterality: N/A;  DM   ESOPHAGOGASTRODUODENOSCOPY (EGD) WITH PROPOFOL N/A 04/06/2021   Procedure: ESOPHAGOGASTRODUODENOSCOPY (EGD) WITH PROPOFOL;  Surgeon: Regis Bill, MD;  Location: [facility];  Service: Endoscopy;  Laterality: N/A;   INCONTINENCE SURGERY     bladder suspension    Social History Social History   Tobacco Use   Smoking status: Never   Smokeless tobacco: Never  Vaping Use   Vaping status: Never Used  Substance Use Topics   Alcohol use: No   Drug use: No    Family History Family History  Problem Relation Age of Onset   Diabetes Mother    Hypertension Mother    Hypertension Father    Breast cancer Niece     No Known Allergies   REVIEW  OF SYSTEMS (Negative unless checked)  Constitutional: [] Weight loss  [] Fever  [] Chills Cardiac: [] Chest pain   [] Chest pressure   [] Palpitations   [] Shortness of breath when laying flat   [] Shortness of breath with exertion. Vascular:  [x] Pain in legs with walking   [] Pain in legs at rest  [] History of DVT   [] Phlebitis   [] Swelling in legs   [] Varicose veins   [] Non-healing ulcers Pulmonary:   [] Uses home oxygen   [] Productive cough   [] Hemoptysis   [] Wheeze  [] COPD   [] Asthma Neurologic:  [] Dizziness   [] Seizures   [] History of stroke   [] History of TIA  [] Aphasia   [] Vissual changes   [] Weakness or numbness in arm   [x] Weakness or numbness in leg Musculoskeletal:   [] Joint swelling   [x] Joint pain   [x] Low back pain Hematologic:  [] Easy bruising  [] Easy bleeding   [] Hypercoagulable state   [] Anemic Gastrointestinal:  [] Diarrhea   [] Vomiting   [] Gastroesophageal reflux/heartburn   [] Difficulty swallowing. Genitourinary:  [] Chronic kidney disease   [] Difficult urination  [] Frequent urination   [] Blood in urine Skin:  [] Rashes   [] Ulcers  Psychological:  [x] History of anxiety   []  History of major depression.  Physical Examination  There were no vitals filed for this visit. There is no height or weight on file to calculate BMI. Gen: WD/WN, NAD Head: [place]/AT, No temporalis wasting.  Ear/Nose/Throat: Hearing grossly intact, nares w/o erythema or drainage Eyes: PER, EOMI, sclera nonicteric.  Neck: Supple, no masses.  No bruit or JVD.  Pulmonary:  Good air movement, no audible wheezing, no use of accessory muscles.  Cardiac: RRR, normal S1, S2, no Murmurs. Vascular:  mild trophic changes, no open wounds Vessel Right Left  Radial Palpable Palpable  PT  Palpable Not Palpable  DP Not Palpable Not Palpable  Gastrointestinal: soft, non-distended. No guarding/no peritoneal signs.  Musculoskeletal: M/S 5/5 throughout.  No visible deformity.  Neurologic: CN 2-12 intact. Pain and light touch intact in extremities.  Symmetrical.  Speech is fluent. Motor exam as listed above. Psychiatric: Judgment intact, Mood & affect appropriate for pt's clinical situation. Dermatologic: No rashes or ulcers noted.  No changes consistent with cellulitis.   CBC Lab Results  Component Value Date   WBC 13.2 (H) 06/11/2023   HGB 11.1 (L) 06/11/2023   HCT 34.6 (L) 06/11/2023   MCV 90.8 06/11/2023   PLT 548 (H) 06/11/2023    BMET    Component Value Date/Time   NA 137 06/11/2023 1402   NA 139 01/24/2012 1213   K 2.8 (L) 06/11/2023 1402   K 4.1 01/24/2012 1213   CL 102 06/11/2023 1402   CL 104 01/24/2012 1213   CO2 27 06/11/2023 1402   CO2 27 01/24/2012 1213   GLUCOSE 143 (H) 06/11/2023 1402   GLUCOSE 129 (H) 01/24/2012 1213   BUN 11 06/11/2023 1402   BUN 10 01/24/2012 1213   CREATININE 0.70 06/11/2023 1402   CREATININE 0.85 01/24/2012 1213    CALCIUM 8.6 (L) 06/11/2023 1402   CALCIUM 9.0 01/24/2012 1213   GFRNONAA >60 06/11/2023 1402   GFRNONAA >60 01/24/2012 1213   GFRAA >60 01/24/2012 1213   Estimated Creatinine Clearance: 53.3 mL/min (by C-G formula based on SCr of 0.7 mg/dL).  COAG No results found for: "INR", "PROTIME"  Radiology No results found.   Assessment/Plan 1. Atherosclerosis of native artery of both lower extremities with intermittent claudication (HCC) (Primary)  Recommend:  The patient has evidence of severe atherosclerotic changes of  both lower extremities associated with ulceration and tissue loss of the left foot.  This represents a limb threatening ischemia and places the patient at the risk for left limb loss.  Patient should undergo angiography of the left lower extremity with the hope for intervention for limb salvage.  The risks and benefits as well as the alternative therapies was discussed in detail with the patient.  All questions were answered.  Patient agrees to proceed with left angiography.  The patient will follow up with me in the office after the procedure.   Z61.096    critical limb ischemia of the lower extremity I70.25      Atherosclerotic occlusive disease with ulceration   CPT codes: 04540   stent placement femoral-popliteal artery 36247   introduction catheter below diaphragm third order  2. Essential hypertension Continue antihypertensive medications as already ordered, these medications have been reviewed and there are no changes at this time.  3. Controlled type 2 diabetes mellitus without complication, without long-term current use of insulin (HCC) Continue hypoglycemic medications as already ordered, these medications have been reviewed and there are no changes at this time.  Hgb A1C to be monitored as already arranged by primary service  4. Degeneration of intervertebral disc of lumbar region, unspecified whether pain present Continue medications to treat the  patient's degenerative disease as already ordered, these medications have been reviewed and there are no changes at this time.  Continued activity and therapy was stressed.    Levora Dredge, MD  06/18/2023 12:45 PM

## 2023-06-19 ENCOUNTER — Encounter (INDEPENDENT_AMBULATORY_CARE_PROVIDER_SITE_OTHER): Payer: Self-pay | Admitting: Vascular Surgery

## 2023-06-19 ENCOUNTER — Ambulatory Visit (INDEPENDENT_AMBULATORY_CARE_PROVIDER_SITE_OTHER): Payer: Medicare Other

## 2023-06-19 ENCOUNTER — Ambulatory Visit (INDEPENDENT_AMBULATORY_CARE_PROVIDER_SITE_OTHER): Payer: Medicare Other | Admitting: Vascular Surgery

## 2023-06-19 VITALS — BP 121/69 | HR 107 | Resp 16 | Wt 124.0 lb

## 2023-06-19 DIAGNOSIS — M51369 Other intervertebral disc degeneration, lumbar region without mention of lumbar back pain or lower extremity pain: Secondary | ICD-10-CM | POA: Diagnosis not present

## 2023-06-19 DIAGNOSIS — I70213 Atherosclerosis of native arteries of extremities with intermittent claudication, bilateral legs: Secondary | ICD-10-CM

## 2023-06-19 DIAGNOSIS — E119 Type 2 diabetes mellitus without complications: Secondary | ICD-10-CM | POA: Diagnosis not present

## 2023-06-19 DIAGNOSIS — I70223 Atherosclerosis of native arteries of extremities with rest pain, bilateral legs: Secondary | ICD-10-CM | POA: Diagnosis not present

## 2023-06-19 DIAGNOSIS — I10 Essential (primary) hypertension: Secondary | ICD-10-CM | POA: Diagnosis not present

## 2023-06-20 ENCOUNTER — Telehealth (INDEPENDENT_AMBULATORY_CARE_PROVIDER_SITE_OTHER): Payer: Self-pay

## 2023-06-20 ENCOUNTER — Encounter (INDEPENDENT_AMBULATORY_CARE_PROVIDER_SITE_OTHER): Payer: Self-pay | Admitting: Vascular Surgery

## 2023-06-20 NOTE — Telephone Encounter (Signed)
 Spoke with the patient and she is scheduled with Dr. Gilda Crease for a left leg angio on 06/27/23 with a 9:00 am arrival time to the [facility]. Pre-procedure instructions were discussed and will be mailed.

## 2023-06-21 ENCOUNTER — Encounter: Attending: Physician Assistant | Admitting: Physician Assistant

## 2023-06-21 DIAGNOSIS — L97822 Non-pressure chronic ulcer of other part of left lower leg with fat layer exposed: Secondary | ICD-10-CM | POA: Insufficient documentation

## 2023-06-21 DIAGNOSIS — E1151 Type 2 diabetes mellitus with diabetic peripheral angiopathy without gangrene: Secondary | ICD-10-CM | POA: Insufficient documentation

## 2023-06-21 DIAGNOSIS — I10 Essential (primary) hypertension: Secondary | ICD-10-CM | POA: Diagnosis not present

## 2023-06-21 DIAGNOSIS — E11622 Type 2 diabetes mellitus with other skin ulcer: Secondary | ICD-10-CM | POA: Diagnosis present

## 2023-06-21 DIAGNOSIS — D508 Other iron deficiency anemias: Secondary | ICD-10-CM | POA: Diagnosis not present

## 2023-06-22 LAB — VAS US ABI WITH/WO TBI
Left ABI: 0.76
Right ABI: 1.07

## 2023-06-27 ENCOUNTER — Other Ambulatory Visit (INDEPENDENT_AMBULATORY_CARE_PROVIDER_SITE_OTHER): Payer: Self-pay | Admitting: Vascular Surgery

## 2023-06-27 ENCOUNTER — Encounter: Payer: Self-pay | Admitting: Vascular Surgery

## 2023-06-27 ENCOUNTER — Encounter
Admission: RE | Disposition: A | Discharge status: Home / Self Care | Payer: Self-pay | Source: Home / Self Care | Attending: Vascular Surgery

## 2023-06-27 ENCOUNTER — Telehealth (INDEPENDENT_AMBULATORY_CARE_PROVIDER_SITE_OTHER): Payer: Self-pay | Admitting: Vascular Surgery

## 2023-06-27 ENCOUNTER — Ambulatory Visit
Admission: RE | Admit: 2023-06-27 | Discharge: 2023-06-27 | Disposition: A | Discharge status: Home / Self Care | Attending: Vascular Surgery | Admitting: Vascular Surgery

## 2023-06-27 DIAGNOSIS — I70222 Atherosclerosis of native arteries of extremities with rest pain, left leg: Secondary | ICD-10-CM | POA: Insufficient documentation

## 2023-06-27 DIAGNOSIS — I70243 Atherosclerosis of native arteries of left leg with ulceration of ankle: Secondary | ICD-10-CM | POA: Diagnosis not present

## 2023-06-27 DIAGNOSIS — E11621 Type 2 diabetes mellitus with foot ulcer: Secondary | ICD-10-CM | POA: Insufficient documentation

## 2023-06-27 DIAGNOSIS — M51369 Other intervertebral disc degeneration, lumbar region without mention of lumbar back pain or lower extremity pain: Secondary | ICD-10-CM | POA: Diagnosis not present

## 2023-06-27 DIAGNOSIS — Z8249 Family history of ischemic heart disease and other diseases of the circulatory system: Secondary | ICD-10-CM | POA: Diagnosis not present

## 2023-06-27 DIAGNOSIS — Z7984 Long term (current) use of oral hypoglycemic drugs: Secondary | ICD-10-CM | POA: Insufficient documentation

## 2023-06-27 DIAGNOSIS — Z833 Family history of diabetes mellitus: Secondary | ICD-10-CM | POA: Insufficient documentation

## 2023-06-27 DIAGNOSIS — I700 Atherosclerosis of aorta: Secondary | ICD-10-CM

## 2023-06-27 DIAGNOSIS — L97909 Non-pressure chronic ulcer of unspecified part of unspecified lower leg with unspecified severity: Secondary | ICD-10-CM

## 2023-06-27 DIAGNOSIS — L97329 Non-pressure chronic ulcer of left ankle with unspecified severity: Secondary | ICD-10-CM

## 2023-06-27 DIAGNOSIS — I70245 Atherosclerosis of native arteries of left leg with ulceration of other part of foot: Secondary | ICD-10-CM | POA: Diagnosis not present

## 2023-06-27 DIAGNOSIS — I10 Essential (primary) hypertension: Secondary | ICD-10-CM | POA: Insufficient documentation

## 2023-06-27 DIAGNOSIS — L97529 Non-pressure chronic ulcer of other part of left foot with unspecified severity: Secondary | ICD-10-CM | POA: Insufficient documentation

## 2023-06-27 HISTORY — PX: LOWER EXTREMITY ANGIOGRAPHY: CATH118251

## 2023-06-27 HISTORY — PX: LOWER EXTREMITY INTERVENTION: CATH118252

## 2023-06-27 LAB — GLUCOSE, CAPILLARY
Glucose-Capillary: 72 mg/dL (ref 70–99)
Glucose-Capillary: 69 mg/dL — ABNORMAL LOW (ref 70–99)
Glucose-Capillary: 107 mg/dL — ABNORMAL HIGH (ref 70–99)

## 2023-06-27 LAB — BUN: BUN: 10 mg/dL (ref 8–23)

## 2023-06-27 LAB — CREATININE, SERUM
Creatinine, Ser: 0.61 mg/dL (ref 0.44–1.00)
GFR, Estimated: >60 mL/min (ref >60)

## 2023-06-27 SURGERY — LOWER EXTREMITY INTERVENTION
Anesthesia: Moderate Sedation | Site: Leg Lower | Laterality: Left

## 2023-06-27 MED ORDER — SODIUM CHLORIDE 0.9% FLUSH: 3.0000 mL | Freq: Two times a day (BID) | INTRAVENOUS | Status: DC

## 2023-06-27 MED ORDER — FENTANYL CITRATE (PF) 100 MCG/2ML IJ SOLN
INTRAMUSCULAR | Status: DC | PRN
  Administered 2023-06-27 (×2): 12.5 ug via INTRAVENOUS
  Administered 2023-06-27: 25 ug via INTRAVENOUS
  Administered 2023-06-27: 12.5 ug via INTRAVENOUS
  Administered 2023-06-27: 50 ug via INTRAVENOUS

## 2023-06-27 MED ORDER — HEPARIN SODIUM (PORCINE) 1000 UNIT/ML IJ SOLN
INTRAMUSCULAR | Status: AC
  Filled 2023-06-27: qty 10

## 2023-06-27 MED ORDER — HYDRALAZINE HCL 20 MG/ML IJ SOLN: 5.0000 mg | INTRAMUSCULAR | Status: DC | PRN

## 2023-06-27 MED ORDER — DIPHENHYDRAMINE HCL 50 MG/ML IJ SOLN: 50.0000 mg | Freq: Once | INTRAMUSCULAR | Status: DC | PRN

## 2023-06-27 MED ORDER — MIDAZOLAM HCL 2 MG/ML PO SYRP
8.0000 mg | ORAL_SOLUTION | Freq: Once | ORAL | Status: DC | PRN
Start: 2023-06-27 — End: 2023-06-27

## 2023-06-27 MED ORDER — CLOPIDOGREL BISULFATE 300 MG PO TABS
300.0000 mg | ORAL_TABLET | ORAL | Status: AC
  Administered 2023-06-27: 300 mg via ORAL

## 2023-06-27 MED ORDER — SODIUM CHLORIDE 0.9 % IV SOLN: 250.0000 mL | INTRAVENOUS | Status: DC | PRN

## 2023-06-27 MED ORDER — LIDOCAINE HCL (PF) 1 % IJ SOLN
INTRAMUSCULAR | Status: DC | PRN
  Administered 2023-06-27: 10 mL via INTRADERMAL

## 2023-06-27 MED ORDER — FENTANYL CITRATE (PF) 100 MCG/2ML IJ SOLN
INTRAMUSCULAR | Status: AC
  Filled 2023-06-27: qty 2

## 2023-06-27 MED ORDER — CEFAZOLIN SODIUM-DEXTROSE 2-4 GM/100ML-% IV SOLN
INTRAVENOUS | Status: AC
  Filled 2023-06-27: qty 100

## 2023-06-27 MED ORDER — OXYCODONE HCL 5 MG PO TABS
5.0000 mg | ORAL_TABLET | ORAL | Status: DC | PRN
  Administered 2023-06-27: 5 mg via ORAL

## 2023-06-27 MED ORDER — HEPARIN SODIUM (PORCINE) 1000 UNIT/ML IJ SOLN
INTRAMUSCULAR | Status: DC | PRN
  Administered 2023-06-27: 5000 [IU] via INTRAVENOUS

## 2023-06-27 MED ORDER — CEFAZOLIN SODIUM-DEXTROSE 2-4 GM/100ML-% IV SOLN
2.0000 g | INTRAVENOUS | Status: AC
  Administered 2023-06-27: 2 g via INTRAVENOUS

## 2023-06-27 MED ORDER — IODIXANOL 320 MG/ML IV SOLN
INTRAVENOUS | Status: DC | PRN
  Administered 2023-06-27: 70 mL via INTRA_ARTERIAL

## 2023-06-27 MED ORDER — FENTANYL CITRATE PF 50 MCG/ML IJ SOSY
PREFILLED_SYRINGE | INTRAMUSCULAR | Status: AC
  Filled 2023-06-27: qty 1

## 2023-06-27 MED ORDER — MORPHINE SULFATE (PF) 4 MG/ML IV SOLN: 2.0000 mg | INTRAVENOUS | Status: DC | PRN

## 2023-06-27 MED ORDER — DEXTROSE 50 % IV SOLN
1.0000 | Freq: Once | INTRAVENOUS | Status: AC
  Administered 2023-06-27: 50 mL via INTRAVENOUS

## 2023-06-27 MED ORDER — DEXTROSE 50 % IV SOLN
INTRAVENOUS | Status: AC
  Filled 2023-06-27: qty 50

## 2023-06-27 MED ORDER — LABETALOL HCL 5 MG/ML IV SOLN: 10.0000 mg | INTRAVENOUS | Status: DC | PRN

## 2023-06-27 MED ORDER — CLOPIDOGREL BISULFATE 75 MG PO TABS: 75.0000 mg | ORAL_TABLET | Freq: Every day | ORAL | 11 refills | Status: DC

## 2023-06-27 MED ORDER — SODIUM CHLORIDE 0.9 % IV SOLN
INTRAVENOUS | Status: DC
  Administered 2023-06-27: 250 mL via INTRAVENOUS

## 2023-06-27 MED ORDER — HYDROMORPHONE HCL 1 MG/ML IJ SOLN: 1.0000 mg | Freq: Once | INTRAMUSCULAR | Status: DC | PRN

## 2023-06-27 MED ORDER — SODIUM CHLORIDE 0.9 % IV SOLN: INTRAVENOUS | Status: DC

## 2023-06-27 MED ORDER — MIDAZOLAM HCL 5 MG/5ML IJ SOLN
INTRAMUSCULAR | Status: AC
  Filled 2023-06-27: qty 5

## 2023-06-27 MED ORDER — HEPARIN (PORCINE) IN NACL 1000-0.9 UT/500ML-% IV SOLN
INTRAVENOUS | Status: DC | PRN
  Administered 2023-06-27: 1000 mL

## 2023-06-27 MED ORDER — ONDANSETRON HCL 4 MG/2ML IJ SOLN: 4.0000 mg | Freq: Four times a day (QID) | INTRAMUSCULAR | Status: DC | PRN

## 2023-06-27 MED ORDER — SODIUM CHLORIDE 0.9% FLUSH: 3.0000 mL | INTRAVENOUS | Status: DC | PRN

## 2023-06-27 MED ORDER — CLOPIDOGREL BISULFATE 75 MG PO TABS
ORAL_TABLET | ORAL | Status: AC
  Filled 2023-06-27: qty 4

## 2023-06-27 MED ORDER — METHYLPREDNISOLONE SODIUM SUCC 125 MG IJ SOLR: 125.0000 mg | Freq: Once | INTRAMUSCULAR | Status: DC | PRN

## 2023-06-27 MED ORDER — OXYCODONE HCL 5 MG PO TABS
ORAL_TABLET | ORAL | Status: AC
  Filled 2023-06-27: qty 1

## 2023-06-27 MED ORDER — FAMOTIDINE 20 MG PO TABS: 40.0000 mg | ORAL_TABLET | Freq: Once | ORAL | Status: DC | PRN

## 2023-06-27 MED ORDER — DEXTROSE 50 % IV SOLN: 25.0000 g | Freq: Once | INTRAVENOUS | Status: DC

## 2023-06-27 MED ORDER — MIDAZOLAM HCL 2 MG/2ML IJ SOLN
INTRAMUSCULAR | Status: DC | PRN
  Administered 2023-06-27: 1 mg via INTRAVENOUS
  Administered 2023-06-27: .5 mg via INTRAVENOUS
  Administered 2023-06-27: 2 mg via INTRAVENOUS
  Administered 2023-06-27: .5 mg via INTRAVENOUS

## 2023-06-27 MED ORDER — PANTOPRAZOLE SODIUM 40 MG PO TBEC: 40.0000 mg | DELAYED_RELEASE_TABLET | Freq: Every day | ORAL | 1 refills | Status: DC

## 2023-06-27 SURGICAL SUPPLY — 28 items
BALLN LUTONIX 018 4X40X130 (BALLOONS) ×1 IMPLANT
BALLN LUTONIX 4X150X130 (BALLOONS) ×1 IMPLANT
BALLN LUTONIX 5X120X130 (BALLOONS) ×1 IMPLANT
BALLN ULTRSCOR 014 2.5X150X150 (BALLOONS) ×1 IMPLANT
BALLOON LUTONIX 018 4X40X130 (BALLOONS) IMPLANT
BALLOON LUTONIX 4X150X130 (BALLOONS) IMPLANT
BALLOON LUTONIX 5X120X130 (BALLOONS) IMPLANT
BALLOON ULTRSC 014 2.5X150X150 (BALLOONS) IMPLANT
CATH ANGIO 5F PIGTAIL 65CM (CATHETERS) IMPLANT
CATH AURYON ATHERECTOMY 0.9 (CATHETERS) IMPLANT
CATH BEACON 5 .038 100 VERT TP (CATHETERS) IMPLANT
CATH TEMPO 5F RIM 65CM (CATHETERS) IMPLANT
COVER PROBE ULTRASOUND 5X96 (MISCELLANEOUS) IMPLANT
DEVICE PRESTO INFLATION (MISCELLANEOUS) IMPLANT
DEVICE STARCLOSE SE CLOSURE (Vascular Products) IMPLANT
GLIDEWIRE ADV .014X300CM (WIRE) IMPLANT
GLIDEWIRE ADV .035X260CM (WIRE) IMPLANT
GOWN STRL REUS W/ TWL LRG LVL3 (GOWN DISPOSABLE) ×1 IMPLANT
NDL ENTRY 21GA 7CM ECHOTIP (NEEDLE) IMPLANT
NEEDLE ENTRY 21GA 7CM ECHOTIP (NEEDLE) ×1 IMPLANT
PACK ANGIOGRAPHY (CUSTOM PROCEDURE TRAY) ×1 IMPLANT
SET INTRO CAPELLA COAXIAL (SET/KITS/TRAYS/PACK) IMPLANT
SHEATH BRITE TIP 5FRX11 (SHEATH) IMPLANT
SHEATH RAABE 6FR (SHEATH) IMPLANT
STENT LIFESTENT 5F 6X120X135 (Permanent Stent) IMPLANT
SYR MEDRAD MARK 7 150ML (SYRINGE) IMPLANT
TUBING CONTRAST HIGH PRESS 72 (TUBING) IMPLANT
WIRE J 3MM .035X145CM (WIRE) IMPLANT

## 2023-06-27 NOTE — Op Note (Signed)
 [facility]  Percutaneous Study/Intervention Procedural Note   Date of Surgery: 06/27/2023  Surgeon:  Levora Dredge  Pre-operative Diagnosis: Atherosclerotic occlusive disease bilateral lower extremities with left lower extremity with rest pain and ulceration.  Post-operative diagnosis:  Same  Procedure(s) Performed:             1.  Introduction catheter into left lower extremity 3rd order catheter placement with additional third order             2.    Contrast injection left lower extremity for distal runoff             3.  Percutaneous transluminal angioplasty and stent placement left superficial femoral and angioplasty of the mid popliteal arteries to 4 to 5 mm             4.  Percutaneous laser atherectomy with transluminal angioplasty of the anterior tibial artery with additional tibioperoneal trunk artery atherectomy and angioplasty             5.  Star close closure right common femoral arteriotomy  Anesthesia: Conscious sedation was administered under my direct supervision by the interventional radiology RN. IV Versed plus fentanyl were utilized. Continuous ECG, pulse oximetry and blood pressure was monitored throughout the entire procedure.  Conscious sedation was for a total of 76 minutes.  Sheath: 6 [place] Rabie right common femoral retrograde  Contrast: 70 cc  Fluoroscopy Time: 14.4 minutes  Indications:  Erica Castillo presents with increasing pain of the left lower extremity.  She also has a nonhealing ulceration of her left foot and ankle.  This suggests the patient is having limb threatening ischemia and places her at high risk for limb loss. The risks and benefits are reviewed all questions answered patient agrees to proceed.  Procedure:   Erica Castillo is a 73 y.o. y.o. female who was identified and appropriate procedural time out was performed.  The patient was then placed supine on the table and prepped and draped in the usual sterile  fashion.    Ultrasound was placed in the sterile sleeve and the right groin was evaluated the right common femoral artery was echolucent and pulsatile indicating patency.  Image was recorded for the permanent record and under real-time visualization a microneedle was inserted into the common femoral artery followed by the microwire and then the micro-sheath.  A J-wire was then advanced through the micro-sheath and a  5 [place] sheath was then inserted over a J-wire. J-wire was then advanced and a 5 French pigtail catheter was positioned at the level of T12.  AP projection of the aorta was then obtained. Pigtail catheter was repositioned to above the bifurcation and a RAO view of the pelvis was obtained.  Subsequently a rim catheter with an Advantage wire was used to cross the aortic bifurcation.  The catheter and wire were advanced down into the left distal external iliac artery. Oblique view of the femoral bifurcation was then obtained and subsequently the wire was reintroduced and the pigtail catheter negotiated into the SFA representing third order catheter placement. Distal runoff was then performed.  5000 units of heparin was then given and allowed to circulate for several minutes.  A 6 French Rabie sheath was advanced up and over the bifurcation and positioned in the proximal superficial femoral artery  KMP catheter and advantage Glidewire were then negotiated down into the distal popliteal.  A 6 mm x 120 mm life stent was then deployed across  the mid SFA extending into the proximal above-knee popliteal.  This was then postdilated with a 5 mm x 120 mm Lutonix drug-eluting balloon inflated to 8 to 10 atm for 1 minute.  Follow-up imaging demonstrated less than 10% residual stenosis and the detector was repositioned to the mid and distal popliteal which was reimaged by hand-injection through the sheath.  A 4 mm x 150 mm Lutonix drug-eluting balloon was then advanced so that it is distal marker was at the  level of the tibial plateau and the popliteal artery was angioplastied.  The inflation was to 10 atm for approximately 1 minute.  Follow-up imaging demonstrated less than 10% residual stenosis in the trifurcation was reimaged and a magnified view.  The advantage wire was then negotiated into the posterior tibial artery and the catheter advanced.  Hand-injection contrast was used to image the tibial anatomy.  A 0.014 run-through wire was then advanced through the catheter and the catheter removed.  An Auryon 0.9 laser catheter was then advanced over the wire and positioned at the origin of the tibioperoneal trunk.  2 passes were made initially using 50 pulses per second and then using 60 pulses per second extending through the entire length of the tibioperoneal trunk into the origin of the posterior tibial artery.  Follow-up imaging now demonstrated recanalization with 30 to 40% residual stenosis.  A 4 mm x 40 mm Lutonix balloon was then advanced into the tibioperoneal trunk and inflated to 8 atm for 1 minute.  Follow-up imaging demonstrated less than 10% residual stenosis throughout the tibioperoneal trunk, peroneal and posterior tibial arteries.  The wire was then pulled back and negotiated into the anterior tibial.  Using the KMP catheter the wire was negotiated across the anterior tibial artery subtotal occlusion.  Hand-injection verified distal outflow and intraluminal positioning.  The 0.014 wire was reintroduced.  The Auryon 0.9 laser was then advanced and beginning just 1 cm distal to the origin the anterior tibial 2 passes were made first at 50 pulses per second and then at 60 pulses per second.  The length covered was approximately 160 mm.  Next a 2.5 mm x 150 mm ultra score balloon was advanced across the lesion 2 inflations were required each to 8 to 10 atm for 1 minute.  Follow-up imaging now demonstrated wide patency of the anterior tibial with less than 10% residual stenosis.  The patient now had 3  vessel runoff to the foot.  After review of these images the sheath is pulled into the right external iliac oblique of the common femoral is obtained and a Star close device deployed. There no immediate Complications.  Findings:  The abdominal aorta is opacified with a bolus injection contrast. Renal arteries are single and widely patent without evidence of hemodynamically significant stenosis.  The aorta itself has diffuse disease but no hemodynamically significant lesions. The common and external iliac arteries are widely patent bilaterally.  The left common femoral is widely patent as is the profunda femoris.  The SFA does indeed have a significant stenosis in its midportion extending into the proximal above-knee popliteal.  This lesion is diffuse but with several areas of greater than 80% stenosis and multiple areas of greater than 60% stenosis associated with the high-grade lesions.  The mid popliteal also had several 60 to 70% stenoses particularly at the level of the femoral condyles and the distal popliteal demonstrates patency with no flow-limiting disease.  The trifurcation is heavily diseased with subtotal occlusion of the anterior  tibial beginning just past its origin and extending for a distance of greater than 150 mm.  The distal one third of the anterior tibial is widely patent.  The tibioperoneal trunk demonstrates a diffuse greater than 80% stenosis throughout its length extending into the very proximal posterior tibial which is otherwise widely patent from its origin down to the foot.  The peroneal is widely patent down to the ankle  Following laser atherectomy with angioplasty tibioperoneal trunk and posterior tibial now is in-line flow and looks quite nice with less than 10% residual stenosis.  Following laser atherectomy with angioplasty of the anterior tibial it also now is widely patent with less than 10% residual stenosis and inline flow to the foot.  Angioplasty and stent placement  of the mid to distal SFA and proximal above-knee popliteal as well as angioplasty alone of the mid popliteal demonstrates an excellent result with less than 10% residual stenosis.  Summary: Successful recanalization left lower extremity for limb salvage                           Disposition: Patient was taken to the recovery room in stable condition having tolerated the procedure well.  Earl Lites Darrelyn Morro 06/27/2023,11:32 AM

## 2023-06-27 NOTE — Telephone Encounter (Signed)
 Per Specials, I LVM for pt on both numbers with her follow up appt of 07/24/23 at 3:00 pm.

## 2023-06-28 ENCOUNTER — Encounter: Payer: Self-pay | Admitting: Vascular Surgery

## 2023-07-05 ENCOUNTER — Encounter: Attending: Physician Assistant | Admitting: Physician Assistant

## 2023-07-05 DIAGNOSIS — L97822 Non-pressure chronic ulcer of other part of left lower leg with fat layer exposed: Secondary | ICD-10-CM | POA: Insufficient documentation

## 2023-07-05 DIAGNOSIS — I10 Essential (primary) hypertension: Secondary | ICD-10-CM | POA: Insufficient documentation

## 2023-07-05 DIAGNOSIS — E1151 Type 2 diabetes mellitus with diabetic peripheral angiopathy without gangrene: Secondary | ICD-10-CM | POA: Diagnosis not present

## 2023-07-05 DIAGNOSIS — E11622 Type 2 diabetes mellitus with other skin ulcer: Secondary | ICD-10-CM | POA: Insufficient documentation

## 2023-07-05 DIAGNOSIS — D508 Other iron deficiency anemias: Secondary | ICD-10-CM | POA: Insufficient documentation

## 2023-07-10 NOTE — H&P (Signed)
 MRN : [id number]  Erica Castillo is a 73 y.o. (19-Dec-1950) female who presents with chief complaint of check circulation.  History of Present Illness:   The patient is seen for evaluation of painful lower extremities and diminished pulses associated with ulceration of the left foot.  The patient notes the ulcer has been present for multiple weeks and has not been improving.  It is very painful and has had some drainage.  No specific history of trauma noted by the patient.  The patient denies fever or chills.  the patient does have diabetes which has been difficult to control.  Patient notes prior to the ulcer developing the extremities were painful particularly with walking.  The patient denies rest pain or dangling of an extremity off the side of the bed during the night for relief. No prior interventions or surgeries.  No history of back problems or DJD of the lumbar sacral spine.   The patient denies amaurosis fugax or recent TIA symptoms. There are no recent neurological changes noted. The patient denies history of DVT, PE or superficial thrombophlebitis. The patient denies recent episodes of angina or shortness of breath.   Current Meds  Medication Sig   amLODipine (NORVASC) 10 MG tablet Take 1 tablet by mouth daily.   aspirin EC 81 MG tablet Take 81 mg by mouth daily.   benazepril (LOTENSIN) 40 MG tablet Take 40 mg by mouth daily.   clopidogrel (PLAVIX) 75 MG tablet Take 1 tablet (75 mg total) by mouth daily.   dapagliflozin propanediol (FARXIGA) 5 MG TABS tablet Take 1 tablet by mouth every morning   glipiZIDE (GLUCOTROL) 5 MG tablet 5 mg 2 (two) times daily before a meal.    levETIRAcetam (KEPPRA) 500 MG tablet Take 500 mg by mouth 2 (two) times daily.   metoprolol succinate (TOPROL-XL) 25 MG 24 hr tablet 50 mg daily.    oxybutynin (DITROPAN) 5 MG tablet Take 5 mg by mouth 2 (two) times daily.     pantoprazole (PROTONIX) 40 MG tablet Take 1 tablet (40 mg total) by mouth daily.   simvastatin (ZOCOR) 20 MG tablet TAKE 1 TABLET BY MOUTH NIGHTLY.   [DISCONTINUED] omeprazole (PRILOSEC) 20 MG capsule TAKE 1 CAPSULE(20 MG) BY MOUTH EVERY DAY    Past Medical History:  Diagnosis Date   Anemia    HX of   Arthritis    Back problem    COVID-19    12/20   Diabetes mellitus without complication (HCC)    Gout    History of seizures    under control x 10 years   Hypercholesterolemia    Hypertension    Indigestion    Vertigo    in past    Past Surgical History:  Procedure Laterality Date   ABDOMINAL HYSTERECTOMY     CATARACT EXTRACTION W/PHACO Right 09/09/2019   Procedure: CATARACT EXTRACTION PHACO AND INTRAOCULAR LENS PLACEMENT (IOC) RIGHT DIABETIC 3.85  00:31.3;  Surgeon: Nevada Crane, MD;  Location: [facility];  Service: Ophthalmology;  Laterality: Right;  Diabetic   CATARACT EXTRACTION [facility] Left 09/30/2019  Procedure: CATARACT EXTRACTION PHACO AND INTRAOCULAR LENS PLACEMENT (IOC) LEFT DIABETIC 2.69  00:28.7;  Surgeon: Nevada Crane, MD;  Location: [facility];  Service: Ophthalmology;  Laterality: Left;  Diabetic - oral meds   CHOLECYSTECTOMY     COLONOSCOPY     COLONOSCOPY WITH PROPOFOL N/A 04/06/2021   Procedure: COLONOSCOPY WITH PROPOFOL;  Surgeon: Regis Bill, MD;  Location: [facility];  Service: Endoscopy;  Laterality: N/A;  DM   ESOPHAGOGASTRODUODENOSCOPY (EGD) WITH PROPOFOL N/A 04/06/2021   Procedure: ESOPHAGOGASTRODUODENOSCOPY (EGD) WITH PROPOFOL;  Surgeon: Regis Bill, MD;  Location: [facility];  Service: Endoscopy;  Laterality: N/A;   INCONTINENCE SURGERY     bladder suspension   LOWER EXTREMITY ANGIOGRAPHY Left 06/27/2023   Procedure: Lower Extremity Angiography;  Surgeon: Renford Dills, MD;  Location: [facility];  Service: Cardiovascular;  Laterality: Left;   LOWER EXTREMITY INTERVENTION Left 06/27/2023    Procedure: LOWER EXTREMITY INTERVENTION;  Surgeon: Renford Dills, MD;  Location: [facility];  Service: Cardiovascular;  Laterality: Left;    Social History Social History   Tobacco Use   Smoking status: Never   Smokeless tobacco: Never  Vaping Use   Vaping status: Never Used  Substance Use Topics   Alcohol use: No   Drug use: No    Family History Family History  Problem Relation Age of Onset   Diabetes Mother    Hypertension Mother    Hypertension Father    Breast cancer Niece     No Known Allergies   REVIEW OF SYSTEMS (Negative unless checked)  Constitutional: [] Weight loss  [] Fever  [] Chills Cardiac: [] Chest pain   [] Chest pressure   [] Palpitations   [] Shortness of breath when laying flat   [] Shortness of breath with exertion. Vascular:  [x] Pain in legs with walking   [x] Pain in legs at rest  [] History of DVT   [] Phlebitis   [] Swelling in legs   [] Varicose veins   [x] Non-healing ulcers Pulmonary:   [] Uses home oxygen   [] Productive cough   [] Hemoptysis   [] Wheeze  [] COPD   [] Asthma Neurologic:  [] Dizziness   [] Seizures   [] History of stroke   [] History of TIA  [] Aphasia   [] Vissual changes   [] Weakness or numbness in arm   [] Weakness or numbness in leg Musculoskeletal:   [] Joint swelling   [] Joint pain   [] Low back pain Hematologic:  [] Easy bruising  [] Easy bleeding   [] Hypercoagulable state   [] Anemic Gastrointestinal:  [] Diarrhea   [] Vomiting  [] Gastroesophageal reflux/heartburn   [] Difficulty swallowing. Genitourinary:  [] Chronic kidney disease   [] Difficult urination  [] Frequent urination   [] Blood in urine Skin:  [] Rashes   [] Ulcers  Psychological:  [] History of anxiety   []  History of major depression.  Physical Examination  Vitals:   06/27/23 1300 06/27/23 1315 06/27/23 1330 06/27/23 1345  BP: (!) 161/89 (!) 157/93 (!) 136/99 108/76  Pulse: (!) 105 99    Resp: 20 (!) 25 18 18   Temp:      TempSrc:      SpO2: 99% 96%    Weight:      Height:        Body mass index is 22.31 kg/m. Gen: WD/WN, NAD Head: [place]/AT, No temporalis wasting.  Ear/Nose/Throat: Hearing grossly intact, nares w/o erythema or drainage Eyes: PER, EOMI, sclera nonicteric.  Neck: Supple, no masses.  No bruit or JVD.  Pulmonary:  Good air movement, no audible wheezing, no use of accessory muscles.  Cardiac:  RRR, normal S1, S2, no Murmurs. Vascular:  mild trophic changes, + open wound left foot Vessel Right Left  Radial Palpable Palpable  PT Palpable Not Palpable  DP Not Palpable Not Palpable  Gastrointestinal: soft, non-distended. No guarding/no peritoneal signs.  Musculoskeletal: M/S 5/5 throughout.  No visible deformity.  Neurologic: CN 2-12 intact. Pain and light touch intact in extremities.  Symmetrical.  Speech is fluent. Motor exam as listed above. Psychiatric: Judgment intact, Mood & affect appropriate for pt's clinical situation. Dermatologic: No rashes or ulcers noted.  No changes consistent with cellulitis.   CBC Lab Results  Component Value Date   WBC 13.2 (H) 06/11/2023   HGB 11.1 (L) 06/11/2023   HCT 34.6 (L) 06/11/2023   MCV 90.8 06/11/2023   PLT 548 (H) 06/11/2023    BMET    Component Value Date/Time   NA 137 06/11/2023 1402   NA 139 01/24/2012 1213   K 2.8 (L) 06/11/2023 1402   K 4.1 01/24/2012 1213   CL 102 06/11/2023 1402   CL 104 01/24/2012 1213   CO2 27 06/11/2023 1402   CO2 27 01/24/2012 1213   GLUCOSE 143 (H) 06/11/2023 1402   GLUCOSE 129 (H) 01/24/2012 1213   BUN 10 06/27/2023 0853   BUN 10 01/24/2012 1213   CREATININE 0.61 06/27/2023 0853   CREATININE 0.85 01/24/2012 1213   CALCIUM 8.6 (L) 06/11/2023 1402   CALCIUM 9.0 01/24/2012 1213   GFRNONAA >60 06/27/2023 0853   GFRNONAA >60 01/24/2012 1213   GFRAA >60 01/24/2012 1213   Estimated Creatinine Clearance: 53.3 mL/min (by C-G formula based on SCr of 0.61 mg/dL).  COAG No results found for: "INR", "PROTIME"  Radiology PERIPHERAL VASCULAR  CATHETERIZATION Result Date: 06/27/2023 See surgical note for result.  VAS [place] ABI WITH/WO TBI Result Date: 06/22/2023  LOWER EXTREMITY DOPPLER STUDY Patient Name:  MAJESTIC MOLONY  Date of Exam:   06/19/2023 Medical Rec #: [id number]       Accession #:    [id number] Date of Birth: January 04, 1951       Patient Gender: F Patient Age:   65 years Exam Location:  [facility] Procedure:      VAS [place] ABI WITH/WO TBI Referring Phys: [facility] --------------------------------------------------------------------------------  Indications: Claudication, ulceration, and peripheral artery disease. Worsening              left leg discoloration and wounds High Risk Factors: Hypertension, hyperlipidemia, Diabetes, no history of                    smoking. Other Factors: Known >75% left SFA stenosis per last left leg duplex                03/20/2023.  Performing Technologist: Hardie Lora RVT  Examination Guidelines: A complete evaluation includes at minimum, Doppler waveform signals and systolic blood pressure reading at the level of bilateral brachial, anterior tibial, and posterior tibial arteries, when vessel segments are accessible. Bilateral testing is considered an integral part of a complete examination. Photoelectric Plethysmograph (PPG) waveforms and toe systolic pressure readings are included as required and additional duplex testing as needed. Limited examinations for reoccurring indications may be performed as noted.  ABI Findings: +---------+------------------+-----+---------+--------+ Right    Rt Pressure (mmHg)IndexWaveform Comment  +---------+------------------+-----+---------+--------+ Brachial 160                                      +---------+------------------+-----+---------+--------+  PTA      170               1.06 triphasic         +---------+------------------+-----+---------+--------+ DP       173               1.07 biphasic           +---------+------------------+-----+---------+--------+ Great Toe118               0.73                   +---------+------------------+-----+---------+--------+ +---------+------------------+-----+----------+-------+ Left     Lt Pressure (mmHg)IndexWaveform  Comment +---------+------------------+-----+----------+-------+ Brachial 161                                      +---------+------------------+-----+----------+-------+ PTA      117               0.73 monophasic        +---------+------------------+-----+----------+-------+ DP       123               0.76 monophasic        +---------+------------------+-----+----------+-------+ Great Toe72                0.45                   +---------+------------------+-----+----------+-------+ +-------+-----------+-----------+------------+------------+ ABI/TBIToday's ABIToday's TBIPrevious ABIPrevious TBI +-------+-----------+-----------+------------+------------+ Right  1.07       0.73       0.99        0.76         +-------+-----------+-----------+------------+------------+ Left   0.76       0.45       0.75        0.49         +-------+-----------+-----------+------------+------------+ Bilateral ABIs appear essentially unchanged compared to prior study on 03/20/2023.  Summary: Right: Resting right ankle-brachial index is within normal range. The right toe-brachial index is normal. Left: Resting left ankle-brachial index indicates moderate left lower extremity arterial disease. The left toe-brachial index is abnormal. *See table(s) above for measurements and observations.  Electronically signed by Levora Dredge MD on 06/22/2023 at 3:52:16 PM.    Final      Assessment/Plan 1. Atherosclerosis of native artery of both lower extremities with intermittent claudication (HCC) (Primary)  Recommend:   The patient has evidence of severe atherosclerotic changes of both lower extremities associated with ulceration and  tissue loss of the left foot.  This represents a limb threatening ischemia and places the patient at the risk for left limb loss.   Patient should undergo angiography of the left lower extremity with the hope for intervention for limb salvage.  The risks and benefits as well as the alternative therapies was discussed in detail with the patient.  All questions were answered.  Patient agrees to proceed with left angiography.   The patient will follow up with me in the office after the procedure.    Z61.096    critical limb ischemia of the lower extremity I70.25      Atherosclerotic occlusive disease with ulceration     CPT codes: 04540   stent placement femoral-popliteal artery 36247   introduction catheter below diaphragm third order   2. Essential hypertension Continue antihypertensive medications as already ordered, these medications have been reviewed and there are no changes at this  time.   3. Controlled type 2 diabetes mellitus without complication, without long-term current use of insulin (HCC) Continue hypoglycemic medications as already ordered, these medications have been reviewed and there are no changes at this time.   Hgb A1C to be monitored as already arranged by primary service   4. Degeneration of intervertebral disc of lumbar region, unspecified whether pain present Continue medications to treat the patient's degenerative disease as already ordered, these medications have been reviewed and there are no changes at this time.   Continued activity and therapy was stressed.   Levora Dredge, MD  07/10/2023 3:56 PM

## 2023-07-14 ENCOUNTER — Encounter: Admitting: Physician Assistant

## 2023-07-14 DIAGNOSIS — E11622 Type 2 diabetes mellitus with other skin ulcer: Secondary | ICD-10-CM | POA: Diagnosis not present

## 2023-07-18 ENCOUNTER — Other Ambulatory Visit (INDEPENDENT_AMBULATORY_CARE_PROVIDER_SITE_OTHER): Payer: Self-pay | Admitting: Vascular Surgery

## 2023-07-18 DIAGNOSIS — Z9889 Other specified postprocedural states: Secondary | ICD-10-CM

## 2023-07-19 ENCOUNTER — Ambulatory Visit: Admitting: Physician Assistant

## 2023-07-24 ENCOUNTER — Encounter (INDEPENDENT_AMBULATORY_CARE_PROVIDER_SITE_OTHER): Payer: Self-pay | Admitting: Vascular Surgery

## 2023-07-24 ENCOUNTER — Ambulatory Visit (INDEPENDENT_AMBULATORY_CARE_PROVIDER_SITE_OTHER): Admitting: Vascular Surgery

## 2023-07-24 ENCOUNTER — Ambulatory Visit (INDEPENDENT_AMBULATORY_CARE_PROVIDER_SITE_OTHER)

## 2023-07-24 VITALS — BP 113/63 | HR 89 | Resp 16

## 2023-07-24 DIAGNOSIS — E119 Type 2 diabetes mellitus without complications: Secondary | ICD-10-CM | POA: Diagnosis not present

## 2023-07-24 DIAGNOSIS — I70223 Atherosclerosis of native arteries of extremities with rest pain, bilateral legs: Secondary | ICD-10-CM | POA: Diagnosis not present

## 2023-07-24 DIAGNOSIS — Z9889 Other specified postprocedural states: Secondary | ICD-10-CM | POA: Diagnosis not present

## 2023-07-24 DIAGNOSIS — M51369 Other intervertebral disc degeneration, lumbar region without mention of lumbar back pain or lower extremity pain: Secondary | ICD-10-CM

## 2023-07-24 DIAGNOSIS — E782 Mixed hyperlipidemia: Secondary | ICD-10-CM

## 2023-07-24 DIAGNOSIS — I739 Peripheral vascular disease, unspecified: Secondary | ICD-10-CM | POA: Diagnosis not present

## 2023-07-24 DIAGNOSIS — I10 Essential (primary) hypertension: Secondary | ICD-10-CM | POA: Diagnosis not present

## 2023-07-24 DIAGNOSIS — E785 Hyperlipidemia, unspecified: Secondary | ICD-10-CM | POA: Insufficient documentation

## 2023-07-24 LAB — VAS US ABI WITH/WO TBI
Left ABI: 1.02
Right ABI: 1.04

## 2023-07-24 NOTE — Progress Notes (Signed)
 MRN : [id number]  Erica Castillo is a 73 y.o. (1951/03/22) female who presents with chief complaint of check circulation.  History of Present Illness:   The patient returns to the office for followup and review status post angiogram with intervention on 06/27/2023.   Procedure:   Percutaneous transluminal angioplasty and stent placement left superficial femoral and angioplasty of the mid popliteal arteries to 4 to 5 mm 2.     Percutaneous laser atherectomy with transluminal angioplasty of the anterior tibial artery with additional tibioperoneal trunk artery atherectomy and angioplasty  The patient notes improvement in the lower extremity symptoms. No interval shortening of the patient's claudication distance or rest pain symptoms. No new ulcers or wounds have occurred since the last visit.  There have been no significant changes to the patient's overall health care.  No documented history of amaurosis fugax or recent TIA symptoms. There are no recent neurological changes noted. No documented history of DVT, PE or superficial thrombophlebitis. The patient denies recent episodes of angina or shortness of breath.   ABI's Rt=1.04 and Lt=1.02  (previous ABI's Rt=1.07 and Lt=0.76)   No outpatient medications have been marked as taking for the 07/24/23 encounter (Appointment) with Prescilla Brod, Ninette Basque, MD.    Past Medical History:  Diagnosis Date   Anemia    HX of   Arthritis    Back problem    COVID-19    12/20   Diabetes mellitus without complication (HCC)    Gout    History of seizures    under control x 10 years   Hypercholesterolemia    Hypertension    Indigestion    Vertigo    in past    Past Surgical History:  Procedure Laterality Date   ABDOMINAL HYSTERECTOMY     CATARACT EXTRACTION W/PHACO Right 09/09/2019   Procedure: CATARACT EXTRACTION PHACO AND INTRAOCULAR LENS PLACEMENT (IOC) RIGHT DIABETIC  3.85  00:31.3;  Surgeon: Rosa College, MD;  Location: [facility];  Service: Ophthalmology;  Laterality: Right;  Diabetic   CATARACT EXTRACTION W/PHACO Left 09/30/2019   Procedure: CATARACT EXTRACTION PHACO AND INTRAOCULAR LENS PLACEMENT (IOC) LEFT DIABETIC 2.69  00:28.7;  Surgeon: Rosa College, MD;  Location: [facility];  Service: Ophthalmology;  Laterality: Left;  Diabetic - oral meds   CHOLECYSTECTOMY     COLONOSCOPY     COLONOSCOPY WITH PROPOFOL  N/A 04/06/2021   Procedure: COLONOSCOPY WITH PROPOFOL ;  Surgeon: Shane Darling, MD;  Location: [facility];  Service: Endoscopy;  Laterality: N/A;  DM   ESOPHAGOGASTRODUODENOSCOPY (EGD) WITH PROPOFOL  N/A 04/06/2021   Procedure: ESOPHAGOGASTRODUODENOSCOPY (EGD) WITH PROPOFOL ;  Surgeon: Shane Darling, MD;  Location: [facility];  Service: Endoscopy;  Laterality: N/A;   INCONTINENCE SURGERY     bladder suspension   LOWER EXTREMITY ANGIOGRAPHY Left 06/27/2023   Procedure: Lower Extremity Angiography;  Surgeon: Jackquelyn Mass, MD;  Location: [facility];  Service: Cardiovascular;  Laterality: Left;   LOWER EXTREMITY INTERVENTION Left 06/27/2023   Procedure: LOWER EXTREMITY INTERVENTION;  Surgeon: Jackquelyn Mass, MD;  Location: [facility];  Service: Cardiovascular;  Laterality: Left;    Social History Social History   Tobacco Use   Smoking status: Never   Smokeless tobacco: Never  Vaping Use   Vaping status: Never Used  Substance Use Topics   Alcohol use: No   Drug use: No    Family History Family History  Problem Relation Age of Onset   Diabetes Mother    Hypertension Mother    Hypertension Father    Breast cancer Niece     No Known Allergies   REVIEW OF SYSTEMS (Negative unless checked)  Constitutional: [] Weight loss  [] Fever  [] Chills Cardiac: [] Chest pain   [] Chest pressure   [] Palpitations   [] Shortness of breath when laying flat   [] Shortness of breath with  exertion. Vascular:  [x] Pain in legs with walking   [] Pain in legs at rest  [] History of DVT   [] Phlebitis   [] Swelling in legs   [] Varicose veins   [] Non-healing ulcers Pulmonary:   [] Uses home oxygen   [] Productive cough   [] Hemoptysis   [] Wheeze  [] COPD   [] Asthma Neurologic:  [] Dizziness   [] Seizures   [] History of stroke   [] History of TIA  [] Aphasia   [] Vissual changes   [] Weakness or numbness in arm   [] Weakness or numbness in leg Musculoskeletal:   [] Joint swelling   [x] Joint pain   [x] Low back pain Hematologic:  [] Easy bruising  [] Easy bleeding   [] Hypercoagulable state   [] Anemic Gastrointestinal:  [] Diarrhea   [] Vomiting  [] Gastroesophageal reflux/heartburn   [] Difficulty swallowing. Genitourinary:  [] Chronic kidney disease   [] Difficult urination  [] Frequent urination   [] Blood in urine Skin:  [] Rashes   [] Ulcers  Psychological:  [x] History of anxiety   []  History of major depression.  Physical Examination  There were no vitals filed for this visit. There is no height or weight on file to calculate BMI. Gen: WD/WN, NAD Head: [place]/AT, No temporalis wasting.  Ear/Nose/Throat: Hearing grossly intact, nares w/o erythema or drainage Eyes: PER, EOMI, sclera nonicteric.  Neck: Supple, no masses.  No bruit or JVD.  Pulmonary:  Good air movement, no audible wheezing, no use of accessory muscles.  Cardiac: RRR, normal S1, S2, no Murmurs. Vascular:  mild trophic changes, + open wounds Vessel Right Left  Radial Palpable Palpable  PT Palpable Not Palpable  DP Palpable Palpable  Gastrointestinal: soft, non-distended. No guarding/no peritoneal signs.  Musculoskeletal: M/S 5/5 throughout.  No visible deformity.  Neurologic: CN 2-12 intact. Pain and light touch intact in extremities.  Symmetrical.  Speech is fluent. Motor exam as listed above. Psychiatric: Judgment intact, Mood & affect appropriate for pt's clinical situation. Dermatologic: No rashes + left leg ulcers noted.  No changes  consistent with cellulitis.   CBC Lab Results  Component Value Date   WBC 13.2 (H) 06/11/2023   HGB 11.1 (L) 06/11/2023   HCT 34.6 (L) 06/11/2023   MCV 90.8 06/11/2023   PLT 548 (H) 06/11/2023    BMET    Component Value Date/Time   NA 137 06/11/2023 1402   NA 139 01/24/2012 1213   K 2.8 (L) 06/11/2023 1402   K 4.1 01/24/2012 1213   CL 102 06/11/2023 1402   CL 104 01/24/2012 1213   CO2 27 06/11/2023 1402   CO2 27 01/24/2012 1213   GLUCOSE 143 (H) 06/11/2023 1402   GLUCOSE 129 (H) 01/24/2012 1213   BUN 10 06/27/2023 0853   BUN 10 01/24/2012 1213   CREATININE 0.61 06/27/2023 0853   CREATININE 0.85  01/24/2012 1213   CALCIUM 8.6 (L) 06/11/2023 1402   CALCIUM 9.0 01/24/2012 1213   GFRNONAA >60 06/27/2023 0853   GFRNONAA >60 01/24/2012 1213   GFRAA >60 01/24/2012 1213   CrCl cannot be calculated (Patient's most recent lab result is older than the maximum 21 days allowed.).  COAG No results found for: "INR", "PROTIME"  Radiology PERIPHERAL VASCULAR CATHETERIZATION Result Date: 06/27/2023 See surgical note for result.    Assessment/Plan 1. Atherosclerosis of native artery of both lower extremities with rest pain (HCC) (Primary) Recommend:  The patient is status post successful angiogram with intervention.  The patient reports that the claudication symptoms and leg pain has improved.   The patient denies lifestyle limiting changes at this point in time.  No further invasive studies, angiography or surgery at this time. The patient should continue walking and begin a more formal exercise program.  The patient should continue antiplatelet therapy and aggressive treatment of the lipid abnormalities  Continued surveillance is indicated as atherosclerosis is likely to progress with time.    Patient should undergo noninvasive studies as ordered. The patient will follow up with me to review the studies.  - VAS US  LOWER EXTREMITY ARTERIAL DUPLEX; Future - VAS US  ABI  WITH/WO TBI; Future  2. Essential hypertension Continue antihypertensive medications as already ordered, these medications have been reviewed and there are no changes at this time.  3. Type 2 diabetes mellitus without complication, without long-term current use of insulin (HCC) Continue hypoglycemic medications as already ordered, these medications have been reviewed and there are no changes at this time.  Hgb A1C to be monitored as already arranged by primary service  4. Degeneration of intervertebral disc of lumbar region, unspecified whether pain present Continue medications to treat the patient's degenerative disease as already ordered, these medications have been reviewed and there are no changes at this time.  Continued activity and therapy was stressed.  5. Mixed hyperlipidemia Continue statin as ordered and reviewed, no changes at this time    Devon Fogo, MD  07/24/2023 3:29 PM

## 2023-07-26 ENCOUNTER — Ambulatory Visit: Admitting: Physician Assistant

## 2023-07-31 ENCOUNTER — Encounter: Admitting: Physician Assistant

## 2023-07-31 DIAGNOSIS — E11622 Type 2 diabetes mellitus with other skin ulcer: Secondary | ICD-10-CM | POA: Diagnosis not present

## 2023-08-14 ENCOUNTER — Encounter: Attending: Physician Assistant | Admitting: Physician Assistant

## 2023-08-14 DIAGNOSIS — L97822 Non-pressure chronic ulcer of other part of left lower leg with fat layer exposed: Secondary | ICD-10-CM | POA: Insufficient documentation

## 2023-08-14 DIAGNOSIS — I7389 Other specified peripheral vascular diseases: Secondary | ICD-10-CM | POA: Insufficient documentation

## 2023-08-14 DIAGNOSIS — E11622 Type 2 diabetes mellitus with other skin ulcer: Secondary | ICD-10-CM | POA: Diagnosis present

## 2023-08-29 ENCOUNTER — Encounter: Admitting: Physician Assistant

## 2023-08-29 ENCOUNTER — Other Ambulatory Visit (INDEPENDENT_AMBULATORY_CARE_PROVIDER_SITE_OTHER): Payer: Self-pay | Admitting: Vascular Surgery

## 2023-08-29 DIAGNOSIS — E11622 Type 2 diabetes mellitus with other skin ulcer: Secondary | ICD-10-CM | POA: Diagnosis not present

## 2023-08-30 ENCOUNTER — Ambulatory Visit: Payer: Self-pay | Admitting: Surgery

## 2023-08-30 ENCOUNTER — Inpatient Hospital Stay
Admission: RE | Admit: 2023-08-30 | Discharge: 2023-08-30 | Disposition: A | Discharge status: Home / Self Care | Source: Ambulatory Visit

## 2023-08-30 NOTE — H&P (Signed)
 Subjective:   CC: Wound on left lower extremity  HPI: Erica Castillo is a 73 y.o. female who was referred by Ottie Blonder III, PA for evaluation of above. Patient states she had a couple ulcers on left lower leg several months that developed into a bigger wound, covering majority of her left shin. Patient has a history of diabetes and is on insulin. Dr. Prescilla Brod with [facility] successfully recanalized her left lower extremity for limb salvage on 06/27/23 for atherosclerotic occlusive disease with rest pain and ulceration. With now better circulation, claudication symptoms and leg pain have improved. Patient reports seeing [facility] last week and was referred for possible debridement. Patient denies any other issues. Denies fever or chills.   Past Medical History: has a past medical history of Anemia (05/11/2007), Atherosclerosis of native arteries of extremity with rest pain (CMS/HHS-HCC) (04/02/2023), Diabetes mellitus type 2, uncomplicated (CMS/HHS-HCC), Essential hypertension (08/19/2014), Hyperlipidemia, Hypertension, Menopause, and Osteoarthritis.  Past Surgical History: has a past surgical history that includes Cholecystectomy; Hx of enterocele repair (11/21/2006); Hysterectomy (1995); Colonoscopy (04/06/2021); and egd (04/06/2021).  Family History: family history includes Aneurysm in her brother and daughter; Diabetes in her sister; Diabetes type II in her father and mother; High blood pressure (Hypertension) in her father and mother.  Social History: reports that she has never smoked. She has never used smokeless tobacco. She reports that she does not drink alcohol and does not use drugs.  Current Medications: has a current medication list which includes the following prescription(s): aspirin , benazepril, clopidogrel , clotrimazole, colchicine, dapagliflozin propanediol, doxycycline , gabapentin, glipizide, lantus solostar u-100 insulin, levetiracetam, metoprolol  succinate, omeprazole, pen needle, diabetic, simvastatin, tramadol, amlodipine, and cyanocobalamin .  Allergies:  No Known Allergies  ROS:  A 15 point review of systems was performed and pertinent positives and negatives noted in HPI  Objective:    BP (!) 149/86  Pulse 86  Ht 157.5 cm (5\' 2" )  Wt 56.7 kg (125 lb)  LMP (LMP Unknown)  BMI 22.86 kg/m   Constitutional : No distress, cooperative, alert  Skin: Cool and moist, eschar wound on left lower extremity, with some healing tissue around the area, circumfrential, essentially involving entire lower leg. No drainage, TTP, erythema to indicate active infection at least 20cm x 10cm  Psychiatric: Normal affect, non-agitated, not confused     LABS:  No imaging   RADS: No imaging.   Assessment:    Wound of left leg, initial encounter (G95.621H): Discussed debridement in OR with skin graft. Discussed healing may take several months. Discussed the importance of managing her diabetes to help reduce risk of infection and help with the healing process. Patient would like to proceed with debridement and skin graft.  Plan:    1. Wound of left leg, initial encounter [S81.802A] Discussed debridement in the operation room with skin graft. Alternatives include continued observation. Benefits include possible symptom relief, pathologic evaluation, improved cosmesis. Discussed the risk of debridement including recurrence, chronic pain, post-op infxn, poor cosmesis, poor/delayed wound healing, and possible re-operation to address said risks. The risks of general anesthetic, if used, includes MI, CVA, sudden death or even reaction to anesthetic medications also discussed.   The patient verbalized understanding and all questions were answered to the patient's satisfaction.  2. Patient has elected to proceed with debridement. Procedure will be scheduled.

## 2023-08-30 NOTE — Pre-Procedure Instructions (Signed)
,   patient s/p angioplasty with Fem stent placed, on Plavix  and Asa, scheduled for wound debridement in OR 05/30, per Sharla Davis NP, patient needs to stay on DAPT for 3 months to ensure stent patency, Dr. Rosea Conch made aware, surgery will be rescheduled. If needed.

## 2023-08-30 NOTE — Patient Instructions (Signed)
 Your procedure is scheduled on: Report to the Registration Desk on the 1st floor of the [facility]. To find out your arrival time, please call [phone] between 1PM - 3PM on: If your arrival time is 6:00 am, do not arrive before that time as the [facility] entrance doors do not open until 6:00 am.  REMEMBER: Instructions that are not followed completely may result in serious medical risk, up to and including death; or upon the discretion of your surgeon and anesthesiologist your surgery may need to be rescheduled.  Do not eat food after midnight the night before surgery.  No gum chewing or hard candies.  You may however, drink CLEAR liquids up to 2 hours before you are scheduled to arrive for your surgery. Do not drink anything within 2 hours of your scheduled arrival time.  Clear liquids include: - water  - apple juice without pulp - gatorade (not RED colors) - black coffee or tea (Do NOT add milk or creamers to the coffee or tea) Do NOT drink anything that is not on this list.  **Type 1 and Type 2 diabetics should only drink water.**  In addition, your doctor has ordered for you to drink the provided:  Ensure Pre-Surgery Clear Carbohydrate Drink  Gatorade G2 Drinking this carbohydrate drink up to two hours before surgery helps to reduce insulin resistance and improve patient outcomes. Please complete drinking 2 hours before scheduled arrival time.  One week prior to surgery: Stop Anti-inflammatories (NSAIDS) such as Advil, Aleve, Ibuprofen, Motrin, Naproxen, Naprosyn and Aspirin  based products such as Excedrin, Goody's Powder, BC Powder. Stop ANY OVER THE COUNTER supplements until after surgery.  You may however, continue to take Tylenol  if needed for pain up until the day of surgery.  **Follow guidelines for insulin and diabetes medications.**  **Follow recommendations regarding stopping blood thinners.**  Continue taking all of your other prescription medications up  until the day before surgery.  ON THE DAY OF SURGERY ONLY TAKE THESE MEDICATIONS WITH SIPS OF WATER:     No Alcohol for 24 hours before or after surgery.  No Smoking including e-cigarettes for 24 hours before surgery.  No chewable tobacco products for at least 6 hours before surgery.  No nicotine patches on the day of surgery.  Do not use any "recreational" drugs for at least a week (preferably 2 weeks) before your surgery.  Please be advised that the combination of cocaine and anesthesia may have negative outcomes, up to and including death. If you test positive for cocaine, your surgery will be cancelled.  On the morning of surgery brush your teeth with toothpaste and water, you may rinse your mouth with mouthwash if you wish. Do not swallow any toothpaste or mouthwash.  Use CHG Soap or wipes as directed on instruction sheet.  Do not wear jewelry, make-up, hairpins, clips or nail polish.  For welded (permanent) jewelry: bracelets, anklets, waist bands, etc.  Please have this removed prior to surgery.  If it is not removed, there is a chance that hospital personnel will need to cut it off on the day of surgery.  Do not wear lotions, powders, or perfumes.   Do not shave body hair from the neck down 48 hours before surgery.  Contact lenses, hearing aids and dentures may not be worn into surgery.  Do not bring valuables to the hospital. [facility] is not responsible for any missing/lost belongings or valuables.   Notify your doctor if there is any change in  your medical condition (cold, fever, infection).  Wear comfortable clothing (specific to your surgery type) to the hospital.  After surgery, you can help prevent lung complications by doing breathing exercises.  Take deep breaths and cough every 1-2 hours. Your doctor may order a device called an Incentive Spirometer to help you take deep breaths. When coughing or sneezing, hold a pillow firmly against your incision with  both hands. This is called "splinting." Doing this helps protect your incision. It also decreases belly discomfort.  If you are being admitted to the hospital overnight, leave your suitcase in the car. After surgery it may be brought to your room.  In case of increased patient census, it may be necessary for you, the patient, to continue your postoperative care in the Same Day Surgery department.  If you are being discharged the day of surgery, you will not be allowed to drive home. You will need a responsible individual to drive you home and stay with you for 24 hours after surgery.   If you are taking public transportation, you will need to have a responsible individual with you.  Please call the Pre-admissions Testing Dept. at [phone] if you have any questions about these instructions.  Surgery Visitation Policy:  Patients having surgery or a procedure may have two visitors.  Children under the age of 72 must have an adult with them who is not the patient.  Inpatient Visitation:    Visiting hours are 7 a.m. to 8 p.m. Up to four visitors are allowed at one time in a patient room. The visitors may rotate out with other people during the day.  One visitor age 63 or older may stay with the patient overnight and must be in the room by 8 p.m. Preparing the Skin Before Surgery     To help prevent the risk of infection at your surgical site, we are now providing you with rinse-free Sage 2% Chlorhexidine Gluconate (CHG) disposable wipes.  Chlorhexidine Gluconate (CHG) Soap  o An antiseptic cleaner that kills germs and bonds with the skin to continue killing germs even after washing  o Used for showering the night before surgery and morning of surgery  The night before surgery: Shower or bathe with warm water. Do not apply perfume, lotions, powders. Wait one hour after shower. Skin should be dry and cool. Open Sage wipe package - use 6 disposable cloths. Wipe body using one  cloth for the right arm, one cloth for the left arm, one cloth for the right leg, one cloth for the left leg, one cloth for the chest/abdomen area, and one cloth for the back. Do not use on open wounds or sores. Do not use on face or genitals (private parts). If you are breast feeding, do not use on breasts. 5. Do not rinse, allow to dry. 6. Skin may feel "tacky" for several minutes. 7. Dress in clean clothes. 8. Place clean sheets on your bed and do not sleep with pets.  REPEAT ABOVE ON THE MORNING OF SURGERY BEFORE ARRIVING TO THE HOSPITAL.

## 2023-09-01 ENCOUNTER — Encounter: Admission: RE | Payer: Self-pay | Source: Home / Self Care

## 2023-09-01 ENCOUNTER — Ambulatory Visit: Admission: RE | Admit: 2023-09-01 | Source: Home / Self Care | Admitting: Surgery

## 2023-09-01 SURGERY — IRRIGATION AND DEBRIDEMENT WOUND
Anesthesia: General | Site: Leg Lower | Laterality: Left

## 2023-09-20 ENCOUNTER — Ambulatory Visit: Admitting: Internal Medicine

## 2023-10-05 ENCOUNTER — Encounter: Attending: Physician Assistant | Admitting: Physician Assistant

## 2023-10-05 DIAGNOSIS — I10 Essential (primary) hypertension: Secondary | ICD-10-CM | POA: Diagnosis not present

## 2023-10-05 DIAGNOSIS — E1151 Type 2 diabetes mellitus with diabetic peripheral angiopathy without gangrene: Secondary | ICD-10-CM | POA: Insufficient documentation

## 2023-10-05 DIAGNOSIS — E11622 Type 2 diabetes mellitus with other skin ulcer: Secondary | ICD-10-CM | POA: Diagnosis present

## 2023-10-05 DIAGNOSIS — L97822 Non-pressure chronic ulcer of other part of left lower leg with fat layer exposed: Secondary | ICD-10-CM | POA: Insufficient documentation

## 2023-10-05 DIAGNOSIS — D508 Other iron deficiency anemias: Secondary | ICD-10-CM | POA: Insufficient documentation

## 2023-10-12 ENCOUNTER — Encounter: Admitting: Physician Assistant

## 2023-10-12 DIAGNOSIS — E11622 Type 2 diabetes mellitus with other skin ulcer: Secondary | ICD-10-CM | POA: Diagnosis not present

## 2023-10-17 DIAGNOSIS — I70239 Atherosclerosis of native arteries of right leg with ulceration of unspecified site: Secondary | ICD-10-CM | POA: Insufficient documentation

## 2023-10-19 ENCOUNTER — Encounter: Admitting: Physician Assistant

## 2023-10-19 DIAGNOSIS — E11622 Type 2 diabetes mellitus with other skin ulcer: Secondary | ICD-10-CM | POA: Diagnosis not present

## 2023-10-20 ENCOUNTER — Other Ambulatory Visit: Payer: Self-pay | Admitting: Internal Medicine

## 2023-10-20 DIAGNOSIS — Z1231 Encounter for screening mammogram for malignant neoplasm of breast: Secondary | ICD-10-CM

## 2023-10-21 NOTE — Progress Notes (Unsigned)
 MRN : [id number]  Erica Castillo is a 73 y.o. (08/30/1950) female who presents with chief complaint of check circulation.  History of Present Illness:   The patient returns to the office for followup and review status post angiogram with intervention on 06/27/2023.    Procedure:   Percutaneous transluminal angioplasty and stent placement left superficial femoral and angioplasty of the mid popliteal arteries to 4 to 5 mm 2.     Percutaneous laser atherectomy with transluminal angioplasty of the anterior tibial artery with additional tibioperoneal trunk artery atherectomy and angioplasty   The patient notes improvement in the lower extremity symptoms. No interval shortening of the patient's claudication distance or rest pain symptoms. No new ulcers or wounds have occurred since the last visit.   There have been no significant changes to the patient's overall health care.   No documented history of amaurosis fugax or recent TIA symptoms. There are no recent neurological changes noted. No documented history of DVT, PE or superficial thrombophlebitis. The patient denies recent episodes of angina or shortness of breath.    ABI's Rt=1.12 and Lt=1.14  (previous ABI's Rt=1.04 and Lt=1.02).  Duplex ultrasound of the left lower extremity arterial system demonstrates patent system no evidence of hemodynamically significant stenosis with triphasic signals in the tibial arteries.  No outpatient medications have been marked as taking for the 10/23/23 encounter (Appointment) with Jama, Cordella MATSU, MD.    Past Medical History:  Diagnosis Date   Anemia    HX of   Arthritis    Back problem    COVID-19    12/20   Diabetes mellitus without complication (HCC)    Gout    History of seizures    under control x 10 years   Hypercholesterolemia    Hypertension    Indigestion    Leg wound, left 2025   Vertigo    in past     Past Surgical History:  Procedure Laterality Date   ABDOMINAL HYSTERECTOMY     CATARACT EXTRACTION W/PHACO Right 09/09/2019   Procedure: CATARACT EXTRACTION PHACO AND INTRAOCULAR LENS PLACEMENT (IOC) RIGHT DIABETIC 3.85  00:31.3;  Surgeon: Myrna Adine Anes, MD;  Location: [facility];  Service: Ophthalmology;  Laterality: Right;  Diabetic   CATARACT EXTRACTION W/PHACO Left 09/30/2019   Procedure: CATARACT EXTRACTION PHACO AND INTRAOCULAR LENS PLACEMENT (IOC) LEFT DIABETIC 2.69  00:28.7;  Surgeon: Myrna Adine Anes, MD;  Location: [facility];  Service: Ophthalmology;  Laterality: Left;  Diabetic - oral meds   CHOLECYSTECTOMY     COLONOSCOPY     COLONOSCOPY WITH PROPOFOL  N/A 04/06/2021   Procedure: COLONOSCOPY WITH PROPOFOL ;  Surgeon: Maryruth Ole DASEN, MD;  Location: [facility];  Service: Endoscopy;  Laterality: N/A;  DM   ESOPHAGOGASTRODUODENOSCOPY (EGD) WITH PROPOFOL  N/A 04/06/2021   Procedure: ESOPHAGOGASTRODUODENOSCOPY (EGD) WITH PROPOFOL ;  Surgeon: Maryruth Ole DASEN, MD;  Location: [facility];  Service: Endoscopy;  Laterality: N/A;   INCONTINENCE SURGERY     bladder suspension   LOWER EXTREMITY ANGIOGRAPHY Left 06/27/2023   Procedure: Lower Extremity Angiography;  Surgeon: Jama Cordella MATSU, MD;  Location: [facility];  Service: Cardiovascular;  Laterality: Left;   LOWER EXTREMITY INTERVENTION Left 06/27/2023   Procedure: LOWER EXTREMITY INTERVENTION;  Surgeon: Jama Cordella MATSU, MD;  Location: [facility];  Service: Cardiovascular;  Laterality: Left;    Social History Social History   Tobacco Use   Smoking status: Never   Smokeless tobacco: Never  Vaping Use   Vaping status: Never Used  Substance Use Topics   Alcohol use: No   Drug use: No    Family History Family History  Problem Relation Age of Onset   Diabetes Mother    Hypertension Mother    Hypertension Father    Breast cancer Niece     No Known  Allergies   REVIEW OF SYSTEMS (Negative unless checked)  Constitutional: [] Weight loss  [] Fever  [] Chills Cardiac: [] Chest pain   [] Chest pressure   [] Palpitations   [] Shortness of breath when laying flat   [] Shortness of breath with exertion. Vascular:  [x] Pain in legs with walking   [] Pain in legs at rest  [] History of DVT   [] Phlebitis   [] Swelling in legs   [] Varicose veins   [] Non-healing ulcers Pulmonary:   [] Uses home oxygen   [] Productive cough   [] Hemoptysis   [] Wheeze  [] COPD   [] Asthma Neurologic:  [] Dizziness   [] Seizures   [] History of stroke   [] History of TIA  [] Aphasia   [] Vissual changes   [] Weakness or numbness in arm   [] Weakness or numbness in leg Musculoskeletal:   [] Joint swelling   [] Joint pain   [] Low back pain Hematologic:  [] Easy bruising  [] Easy bleeding   [] Hypercoagulable state   [] Anemic Gastrointestinal:  [] Diarrhea   [] Vomiting  [] Gastroesophageal reflux/heartburn   [] Difficulty swallowing. Genitourinary:  [] Chronic kidney disease   [] Difficult urination  [] Frequent urination   [] Blood in urine Skin:  [] Rashes   [] Ulcers  Psychological:  [] History of anxiety   []  History of major depression.  Physical Examination  There were no vitals filed for this visit. There is no height or weight on file to calculate BMI. Gen: WD/WN, NAD Head: [place]/AT, No temporalis wasting.  Ear/Nose/Throat: Hearing grossly intact, nares w/o erythema or drainage Eyes: PER, EOMI, sclera nonicteric.  Neck: Supple, no masses.  No bruit or JVD.  Pulmonary:  Good air movement, no audible wheezing, no use of accessory muscles.  Cardiac: RRR, normal S1, S2, no Murmurs. Vascular:  mild trophic changes, no open wounds Vessel Right Left  Radial Palpable Palpable  PT  Palpable Palpable  DP Palpable Palpable  Gastrointestinal: soft, non-distended. No guarding/no peritoneal signs.  Musculoskeletal: M/S 5/5 throughout.  No visible deformity.  Neurologic: CN 2-12 intact. Pain and light touch  intact in extremities.  Symmetrical.  Speech is fluent. Motor exam as listed above. Psychiatric: Judgment intact, Mood & affect appropriate for pt's clinical situation. Dermatologic: No rashes or ulcers noted.  No changes consistent with cellulitis.   CBC Lab Results  Component Value Date   WBC 13.2 (H) 06/11/2023   HGB 11.1 (L) 06/11/2023   HCT 34.6 (L) 06/11/2023   MCV 90.8 06/11/2023   PLT 548 (H) 06/11/2023    BMET    Component Value Date/Time   NA 137 06/11/2023 1402   NA 139 01/24/2012 1213   K 2.8 (L) 06/11/2023 1402   K 4.1 01/24/2012 1213   CL 102 06/11/2023 1402   CL 104 01/24/2012 1213   CO2 27 06/11/2023 1402   CO2 27 01/24/2012 1213  GLUCOSE 143 (H) 06/11/2023 1402   GLUCOSE 129 (H) 01/24/2012 1213   BUN 10 06/27/2023 0853   BUN 10 01/24/2012 1213   CREATININE 0.61 06/27/2023 0853   CREATININE 0.85 01/24/2012 1213   CALCIUM 8.6 (L) 06/11/2023 1402   CALCIUM 9.0 01/24/2012 1213   GFRNONAA >60 06/27/2023 0853   GFRNONAA >60 01/24/2012 1213   GFRAA >60 01/24/2012 1213   CrCl cannot be calculated (Patient's most recent lab result is older than the maximum 21 days allowed.).  COAG No results found for: INR, PROTIME  Radiology No results found.   Assessment/Plan 1. Atherosclerosis of native artery of both lower extremities with intermittent claudication (HCC) (Primary) Recommend:   The patient is status post successful angiogram with intervention.  The patient reports that the claudication symptoms and leg pain has improved.   The patient denies lifestyle limiting changes at this point in time.   No further invasive studies, angiography or surgery at this time. The patient should continue walking and begin a more formal exercise program.  The patient should continue antiplatelet therapy and aggressive treatment of the lipid abnormalities   Continued surveillance is indicated as atherosclerosis is likely to progress with time.     Patient should  undergo noninvasive studies as ordered. The patient will follow up with me to review the studies.  - VAS US  LOWER EXTREMITY ARTERIAL DUPLEX; Future - VAS US  ABI WITH/WO TBI; Future  2. Essential hypertension Continue antihypertensive medications as already ordered, these medications have been reviewed and there are no changes at this time.  3. Type 2 diabetes mellitus without complication, without long-term current use of insulin (HCC) Continue hypoglycemic medications as already ordered, these medications have been reviewed and there are no changes at this time.  Hgb A1C to be monitored as already arranged by primary service  4. Mixed hyperlipidemia Continue statin as ordered and reviewed, no changes at this time    Cordella Shawl, MD  10/21/2023 4:05 PM

## 2023-10-23 ENCOUNTER — Encounter (INDEPENDENT_AMBULATORY_CARE_PROVIDER_SITE_OTHER): Payer: Self-pay | Admitting: Vascular Surgery

## 2023-10-23 ENCOUNTER — Ambulatory Visit (INDEPENDENT_AMBULATORY_CARE_PROVIDER_SITE_OTHER): Admitting: Vascular Surgery

## 2023-10-23 ENCOUNTER — Ambulatory Visit (INDEPENDENT_AMBULATORY_CARE_PROVIDER_SITE_OTHER)

## 2023-10-23 ENCOUNTER — Telehealth: Payer: Self-pay | Admitting: Oncology

## 2023-10-23 VITALS — BP 130/68 | HR 73 | Ht 64.0 in | Wt 128.0 lb

## 2023-10-23 DIAGNOSIS — E119 Type 2 diabetes mellitus without complications: Secondary | ICD-10-CM | POA: Diagnosis not present

## 2023-10-23 DIAGNOSIS — I10 Essential (primary) hypertension: Secondary | ICD-10-CM

## 2023-10-23 DIAGNOSIS — I70213 Atherosclerosis of native arteries of extremities with intermittent claudication, bilateral legs: Secondary | ICD-10-CM | POA: Diagnosis not present

## 2023-10-23 DIAGNOSIS — I70223 Atherosclerosis of native arteries of extremities with rest pain, bilateral legs: Secondary | ICD-10-CM

## 2023-10-23 DIAGNOSIS — E782 Mixed hyperlipidemia: Secondary | ICD-10-CM

## 2023-10-23 MED ORDER — CLOPIDOGREL BISULFATE 75 MG PO TABS: 75.0000 mg | ORAL_TABLET | Freq: Every day | ORAL | 2 refills | Status: AC

## 2023-10-23 NOTE — Telephone Encounter (Signed)
 Pt left vm and wants to schedule an iron appt.  Was last seen on 03/21/2023 and the los stated no f/u needed  Please advise.  Thank you.

## 2023-10-23 NOTE — Telephone Encounter (Signed)
 Patient called and states Dr. Sadie said she needs an iron infusion. She would like a call back to schedule. Last seen 12/24- no follow up needed.    The number listed is correct and you can leave a message.

## 2023-10-24 ENCOUNTER — Ambulatory Visit
Admission: RE | Admit: 2023-10-24 | Discharge: 2023-10-24 | Disposition: A | Discharge status: Home / Self Care | Source: Ambulatory Visit | Attending: Internal Medicine | Admitting: Internal Medicine

## 2023-10-24 ENCOUNTER — Ambulatory Visit: Admitting: Physician Assistant

## 2023-10-24 DIAGNOSIS — Z1231 Encounter for screening mammogram for malignant neoplasm of breast: Secondary | ICD-10-CM | POA: Insufficient documentation

## 2023-10-24 LAB — VAS US ABI WITH/WO TBI
Left ABI: 1.14
Right ABI: 1.12

## 2023-10-25 ENCOUNTER — Encounter (INDEPENDENT_AMBULATORY_CARE_PROVIDER_SITE_OTHER): Payer: Self-pay | Admitting: Vascular Surgery

## 2023-10-26 ENCOUNTER — Encounter: Admitting: Physician Assistant

## 2023-10-26 DIAGNOSIS — E11622 Type 2 diabetes mellitus with other skin ulcer: Secondary | ICD-10-CM | POA: Diagnosis not present

## 2023-11-02 ENCOUNTER — Encounter: Admitting: Physician Assistant

## 2023-11-02 DIAGNOSIS — E11622 Type 2 diabetes mellitus with other skin ulcer: Secondary | ICD-10-CM | POA: Diagnosis not present

## 2023-11-03 ENCOUNTER — Other Ambulatory Visit: Payer: Self-pay

## 2023-11-03 DIAGNOSIS — D509 Iron deficiency anemia, unspecified: Secondary | ICD-10-CM

## 2023-11-05 NOTE — Telephone Encounter (Signed)
 She can see lauren and get iv iron on that day. She had labs in July so no repeat labs needed

## 2023-11-09 ENCOUNTER — Encounter: Payer: Self-pay | Admitting: Nurse Practitioner

## 2023-11-09 ENCOUNTER — Inpatient Hospital Stay

## 2023-11-09 ENCOUNTER — Inpatient Hospital Stay: Attending: Oncology

## 2023-11-09 ENCOUNTER — Inpatient Hospital Stay (HOSPITAL_BASED_OUTPATIENT_CLINIC_OR_DEPARTMENT_OTHER): Admitting: Nurse Practitioner

## 2023-11-09 VITALS — BP 151/68 | HR 80 | Temp 97.0°F | Resp 17 | Wt 130.0 lb

## 2023-11-09 VITALS — BP 164/89 | HR 80 | Temp 95.0°F | Resp 18

## 2023-11-09 DIAGNOSIS — E538 Deficiency of other specified B group vitamins: Secondary | ICD-10-CM | POA: Insufficient documentation

## 2023-11-09 DIAGNOSIS — D509 Iron deficiency anemia, unspecified: Secondary | ICD-10-CM | POA: Insufficient documentation

## 2023-11-09 LAB — IRON AND TIBC
Iron: 32 ug/dL (ref 28–170)
Saturation Ratios: 11 % (ref 10.4–31.8)
TIBC: 301 ug/dL (ref 250–450)
UIBC: 269 ug/dL

## 2023-11-09 LAB — CBC WITH DIFFERENTIAL (CANCER CENTER ONLY)
Abs Immature Granulocytes: 0.1 K/uL — ABNORMAL HIGH (ref 0.00–0.07)
Basophils Absolute: 0 K/uL (ref 0.0–0.1)
Basophils Relative: 0 %
Eosinophils Absolute: 0.1 K/uL (ref 0.0–0.5)
Eosinophils Relative: 1 %
HCT: 37.5 % (ref 36.0–46.0)
Hemoglobin: 11.8 g/dL — ABNORMAL LOW (ref 12.0–15.0)
Immature Granulocytes: 1 %
Lymphocytes Relative: 20 %
Lymphs Abs: 2.7 K/uL (ref 0.7–4.0)
MCH: 30.3 pg (ref 26.0–34.0)
MCHC: 31.5 g/dL (ref 30.0–36.0)
MCV: 96.2 fL (ref 80.0–100.0)
Monocytes Absolute: 0.6 K/uL (ref 0.1–1.0)
Monocytes Relative: 5 %
Neutro Abs: 9.8 K/uL — ABNORMAL HIGH (ref 1.7–7.7)
Neutrophils Relative %: 73 %
Platelet Count: 467 K/uL — ABNORMAL HIGH (ref 150–400)
RBC: 3.9 MIL/uL (ref 3.87–5.11)
RDW: 13 % (ref 11.5–15.5)
WBC Count: 13.3 K/uL — ABNORMAL HIGH (ref 4.0–10.5)
nRBC: 0 % (ref 0.0–0.2)

## 2023-11-09 LAB — FERRITIN: Ferritin: 13 ng/mL (ref 11–307)

## 2023-11-09 LAB — VITAMIN B12: Vitamin B-12: 300 pg/mL (ref 180–914)

## 2023-11-09 MED ORDER — SODIUM CHLORIDE 0.9 % IV SOLN
510.0000 mg | INTRAVENOUS | Status: DC
  Administered 2023-11-09: 510 mg via INTRAVENOUS
  Filled 2023-11-09: qty 510

## 2023-11-09 NOTE — Progress Notes (Signed)
 Hematology/Oncology Consult Note [facility]  [phone] [phone]  Patient Care Team: Sadie Manna, MD as PCP - General (Internal Medicine) Melanee Annah BROCKS, MD as Consulting Physician (Oncology)   Name of the patient: Erica Castillo  [id number]  1950-11-18   Date of visit: 11/09/23  Diagnosis-   Iron and B12 deficiency anemia  Chief complaint/ Reason for visit- routine follow-up of iron and B12 deficiency anemia  Heme/Onc history: patient is a 73 year old female who was seen by me back in 2019 for leukocytosis which was thought to be reactive.  Peripheral flow cytometry did not reveal any immunophenotypic abnormality and BCR ABL testing was normal.  She has now been referred for anemia.  Most recent CBC shows a white cell count of 15.4, H&H of 11.7/38 with an MCV of 84 and a platelet count of 545.  Her white cell count has been between 11-12 at least dating back to 2015.  Also her platelet count has mostly been within normal limits but since May 2023 she was noted to have an elevated platelet count as well.  Hemoglobin has been mostly around 12 iron studies recently showed a ferritin level of 4 TSH was normal B12 levels were low at 199    Patient received 2 doses of Feraheme  in October 2023 and 1 dose of B12 injection.  Interval history- Erica Castillo is a 73 y.o. female who returns to clinic for routine follow up of her iron deficiency and b12 deficiency. She reports some ongoing fatigue. She' sbeen losing weight. Denies any neurologic complaints. Denies recent fevers or illnesses. Denies any easy bleeding or bruising. No melena or hematochezia. No pica or restless leg. Reports good appetite and denies weight loss. Denies chest pain. Denies any nausea, vomiting, constipation, or diarrhea. Denies urinary complaints. Patient offers no further specific complaints today.  ECOG PS- 1 Pain scale- 0  Review of systems- Review of Systems   Constitutional:  Positive for malaise/fatigue. Negative for chills, fever and weight loss.  HENT:  Negative for congestion, ear discharge and nosebleeds.   Eyes:  Negative for blurred vision.  Respiratory:  Negative for cough, hemoptysis, sputum production, shortness of breath and wheezing.   Cardiovascular:  Negative for chest pain, palpitations, orthopnea and claudication.  Gastrointestinal:  Negative for abdominal pain, blood in stool, constipation, diarrhea, heartburn, melena, nausea and vomiting.  Genitourinary:  Negative for dysuria, flank pain, frequency, hematuria and urgency.  Musculoskeletal:  Negative for back pain, joint pain and myalgias.  Skin:  Negative for rash.  Neurological:  Negative for dizziness, tingling, focal weakness, seizures, weakness and headaches.  Endo/Heme/Allergies:  Does not bruise/bleed easily.  Psychiatric/Behavioral:  Negative for depression and suicidal ideas. The patient does not have insomnia.     No Known Allergies  Past Medical History:  Diagnosis Date   Anemia    HX of   Arthritis    Back problem    COVID-19    12/20   Diabetes mellitus without complication (HCC)    Gout    History of seizures    under control x 10 years   Hypercholesterolemia    Hypertension    Indigestion    Leg wound, left 2025   Vertigo    in past   Past Surgical History:  Procedure Laterality Date   ABDOMINAL HYSTERECTOMY     CATARACT EXTRACTION W/PHACO Right 09/09/2019   Procedure: CATARACT EXTRACTION PHACO AND INTRAOCULAR LENS PLACEMENT (IOC) RIGHT DIABETIC 3.85  00:31.3;  Surgeon:  Myrna Adine Anes, MD;  Location: [facility];  Service: Ophthalmology;  Laterality: Right;  Diabetic   CATARACT EXTRACTION W/PHACO Left 09/30/2019   Procedure: CATARACT EXTRACTION PHACO AND INTRAOCULAR LENS PLACEMENT (IOC) LEFT DIABETIC 2.69  00:28.7;  Surgeon: Myrna Adine Anes, MD;  Location: [facility];  Service: Ophthalmology;  Laterality: Left;  Diabetic - oral  meds   CHOLECYSTECTOMY     COLONOSCOPY     COLONOSCOPY WITH PROPOFOL  N/A 04/06/2021   Procedure: COLONOSCOPY WITH PROPOFOL ;  Surgeon: Maryruth Ole DASEN, MD;  Location: [facility];  Service: Endoscopy;  Laterality: N/A;  DM   ESOPHAGOGASTRODUODENOSCOPY (EGD) WITH PROPOFOL  N/A 04/06/2021   Procedure: ESOPHAGOGASTRODUODENOSCOPY (EGD) WITH PROPOFOL ;  Surgeon: Maryruth Ole DASEN, MD;  Location: [facility];  Service: Endoscopy;  Laterality: N/A;   INCONTINENCE SURGERY     bladder suspension   LOWER EXTREMITY ANGIOGRAPHY Left 06/27/2023   Procedure: Lower Extremity Angiography;  Surgeon: Jama Cordella MATSU, MD;  Location: [facility];  Service: Cardiovascular;  Laterality: Left;   LOWER EXTREMITY INTERVENTION Left 06/27/2023   Procedure: LOWER EXTREMITY INTERVENTION;  Surgeon: Jama Cordella MATSU, MD;  Location: [facility];  Service: Cardiovascular;  Laterality: Left;   Social History   Socioeconomic History   Marital status: Divorced    Spouse name: Not on file   Number of children: Not on file   Years of education: Not on file   Highest education level: Not on file  Occupational History   Not on file  Tobacco Use   Smoking status: Never   Smokeless tobacco: Never  Vaping Use   Vaping status: Never Used  Substance and Sexual Activity   Alcohol use: No   Drug use: No   Sexual activity: Not on file  Other Topics Concern   Not on file  Social History Narrative   Not on file   Social Drivers of Health   Financial Resource Strain: Low Risk  (04/25/2023)   Received from [facility]   Overall Financial Resource Strain (CARDIA)    Difficulty of Paying Living Expenses: Not hard at all  Food Insecurity: No Food Insecurity (04/25/2023)   Received from [facility]   Hunger Vital Sign    Within the past 12 months, you worried that your food would run out before you got the money to buy more.: Never true    Within the past 12  months, the food you bought just didn't last and you didn't have money to get more.: Never true  Transportation Needs: No Transportation Needs (04/25/2023)   Received from [facility] - Transportation    In the past 12 months, has lack of transportation kept you from medical appointments or from getting medications?: No    Lack of Transportation (Non-Medical): No  Physical Activity: Not on file  Stress: Not on file  Social Connections: Not on file  Intimate Partner Violence: Not on file   Family History  Problem Relation Age of Onset   Diabetes Mother    Hypertension Mother    Hypertension Father    Breast cancer Niece    Current Outpatient Medications:    acetaminophen  (TYLENOL ) 500 MG tablet, Take 500 mg by mouth every 6 (six) hours as needed for moderate pain (pain score 4-6)., Disp: , Rfl:    amLODipine (NORVASC) 10 MG tablet, Take 10 mg by mouth daily., Disp: , Rfl:    aspirin  EC 81 MG tablet, Take  81 mg by mouth daily., Disp: , Rfl:    BD PEN NEEDLE NANO 2ND GEN 32G X 4 MM MISC, See admin instructions., Disp: , Rfl:    benazepril (LOTENSIN) 40 MG tablet, Take 40 mg by mouth daily., Disp: , Rfl:    clopidogrel  (PLAVIX ) 75 MG tablet, Take 1 tablet (75 mg total) by mouth daily., Disp: 90 tablet, Rfl: 2   dapagliflozin propanediol (FARXIGA) 10 MG TABS tablet, Take 10 mg by mouth daily., Disp: , Rfl:    glipiZIDE (GLUCOTROL) 5 MG tablet, Take 5-10 mg by mouth See admin instructions. Take 10 mg by mouth in the morning and 5 mg at night, Disp: , Rfl: 5   LANTUS SOLOSTAR 100 UNIT/ML Solostar Pen, Inject 12 Units into the skin at bedtime., Disp: , Rfl:    levETIRAcetam (KEPPRA) 500 MG tablet, Take 500 mg by mouth 2 (two) times daily., Disp: , Rfl:    metoprolol succinate (TOPROL-XL) 50 MG 24 hr tablet, Take 50 mg by mouth daily., Disp: , Rfl:    oxybutynin (DITROPAN) 5 MG tablet, Take 5 mg by mouth 2 (two) times daily., Disp: , Rfl: 5   pantoprazole  (PROTONIX )  40 MG tablet, TAKE 1 TABLET(40 MG) BY MOUTH DAILY, Disp: 90 tablet, Rfl: 0   simvastatin (ZOCOR) 20 MG tablet, Take 20 mg by mouth at bedtime., Disp: , Rfl:    traMADol (ULTRAM) 50 MG tablet, Take 50 mg by mouth every 6 (six) hours as needed for moderate pain (pain score 4-6)., Disp: , Rfl:   Physical exam:  Vitals:   11/09/23 1317  BP: (!) 151/68  Pulse: 80  Resp: 17  Temp: (!) 97 F (36.1 C)  TempSrc: Tympanic  SpO2: 99%  Weight: 130 lb (59 kg)    Physical Exam Constitutional:      Appearance: She is not ill-appearing.  Cardiovascular:     Rate and Rhythm: Normal rate and regular rhythm.     Heart sounds: Normal heart sounds.  Pulmonary:     Effort: Pulmonary effort is normal.  Skin:    General: Skin is warm and dry.  Neurological:     Mental Status: She is alert and oriented to person, place, and time.        Latest Ref Rng & Units 11/09/2023   12:30 PM  CBC  WBC 4.0 - 10.5 K/uL 13.3   Hemoglobin 12.0 - 15.0 g/dL 88.1   Hematocrit 63.9 - 46.0 % 37.5   Platelets 150 - 400 K/uL 467   Iron/TIBC/Ferritin/ %Sat    Component Value Date/Time   IRON 65 03/21/2023 1322   TIBC 284 03/21/2023 1322   FERRITIN 24 03/21/2023 1322   IRONPCTSAT 23 03/21/2023 1322    Assessment and plan- Patient is a 73 y.o. female who was previously released to follow up with PCP who returns to clinic to reestablish care:   Iron Deficiency Anemia- Patient last received feraheme  02/01/22. Hmg 11.8 today. Had previously normalized. Ferritin and iron studies are pending. Ferritin with PCP was 16. Hx of diverticulosis & gastritis which may be contributing to GI losses as well as hiatal hernia which may contribute to malabsorption. Last colonoscopy and EGD in 2023. Prefers to hold off on additional workup for now which is reasonable. Proceed with feraheme  today. Additional doses based on labs. Follow up in 3 months.  B12 Deficiency- Level pending today but b12 with pcp was > 1500. Hold injections  today.  Leukocytosis- chronic problem. WBC has ranged from 10.9-14.4  over past 7 years. Today, wbc 13.3, ANC 9.8. She is currently being treatment for PAD ulcer of LLE which I suspect is contributing.  Thrombocytosis- plt 467. Question if reactive? Monitor. On plavix .   Disposition:  Feraheme  today 3 mo- lab (cbc, ferritin, iron studies), Dr Melanee, +/- feraheme - la   Visit Diagnosis 1. Iron deficiency anemia, unspecified iron deficiency anemia type   2. B12 deficiency     Tinnie Dawn, DNP, AGNP-C, [facility] at [facility] [phone] (clinic) 11/09/2023

## 2023-11-13 ENCOUNTER — Encounter: Attending: Physician Assistant | Admitting: Physician Assistant

## 2023-11-13 DIAGNOSIS — D508 Other iron deficiency anemias: Secondary | ICD-10-CM | POA: Diagnosis not present

## 2023-11-13 DIAGNOSIS — L97822 Non-pressure chronic ulcer of other part of left lower leg with fat layer exposed: Secondary | ICD-10-CM | POA: Insufficient documentation

## 2023-11-13 DIAGNOSIS — E11622 Type 2 diabetes mellitus with other skin ulcer: Secondary | ICD-10-CM | POA: Diagnosis present

## 2023-11-13 DIAGNOSIS — I10 Essential (primary) hypertension: Secondary | ICD-10-CM | POA: Insufficient documentation

## 2023-11-13 DIAGNOSIS — I7389 Other specified peripheral vascular diseases: Secondary | ICD-10-CM | POA: Diagnosis not present

## 2023-11-22 ENCOUNTER — Ambulatory Visit: Admitting: Physician Assistant

## 2023-11-22 DIAGNOSIS — E11622 Type 2 diabetes mellitus with other skin ulcer: Secondary | ICD-10-CM | POA: Diagnosis not present

## 2023-11-23 ENCOUNTER — Ambulatory Visit

## 2023-11-23 VITALS — BP 124/60 | HR 78 | Temp 98.2°F | Resp 16

## 2023-11-23 DIAGNOSIS — D509 Iron deficiency anemia, unspecified: Secondary | ICD-10-CM

## 2023-11-23 MED ORDER — SODIUM CHLORIDE 0.9 % IV SOLN
510.0000 mg | INTRAVENOUS | Status: DC
  Administered 2023-11-23: 510 mg via INTRAVENOUS
  Filled 2023-11-23: qty 510

## 2023-11-23 NOTE — Patient Instructions (Signed)

## 2023-12-04 ENCOUNTER — Other Ambulatory Visit (INDEPENDENT_AMBULATORY_CARE_PROVIDER_SITE_OTHER): Payer: Self-pay | Admitting: Vascular Surgery

## 2023-12-06 ENCOUNTER — Ambulatory Visit: Admitting: Physician Assistant

## 2023-12-12 ENCOUNTER — Encounter: Attending: Physician Assistant | Admitting: Physician Assistant

## 2023-12-12 DIAGNOSIS — I10 Essential (primary) hypertension: Secondary | ICD-10-CM | POA: Insufficient documentation

## 2023-12-12 DIAGNOSIS — I7389 Other specified peripheral vascular diseases: Secondary | ICD-10-CM | POA: Insufficient documentation

## 2023-12-12 DIAGNOSIS — E11622 Type 2 diabetes mellitus with other skin ulcer: Secondary | ICD-10-CM | POA: Insufficient documentation

## 2023-12-12 DIAGNOSIS — D508 Other iron deficiency anemias: Secondary | ICD-10-CM | POA: Insufficient documentation

## 2023-12-12 DIAGNOSIS — L97822 Non-pressure chronic ulcer of other part of left lower leg with fat layer exposed: Secondary | ICD-10-CM | POA: Diagnosis not present

## 2023-12-19 IMAGING — MG MM DIGITAL SCREENING BILAT W/ TOMO AND CAD
6 of 10 series · 6 of 30 positions shown · non-contrast
Comparison: Previous exam(s).

CLINICAL DATA: Screening.

EXAM:
DIGITAL SCREENING BILATERAL MAMMOGRAM WITH TOMOSYNTHESIS AND CAD
TECHNIQUE: Bilateral screening digital craniocaudal and mediolateral oblique
mammograms were obtained. Bilateral screening digital breast
tomosynthesis was performed. The images were evaluated with
computer-aided detection.

[R MLO synth-2D (1 of 2)]
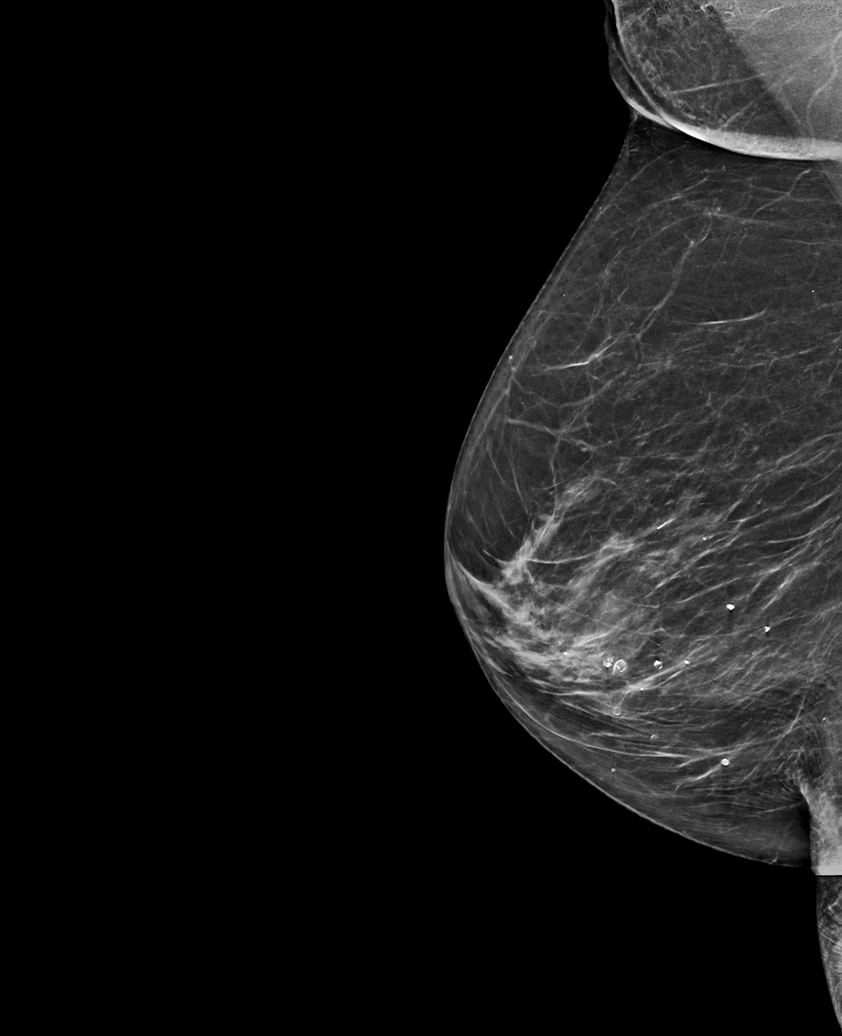

[L CC synth-2D]
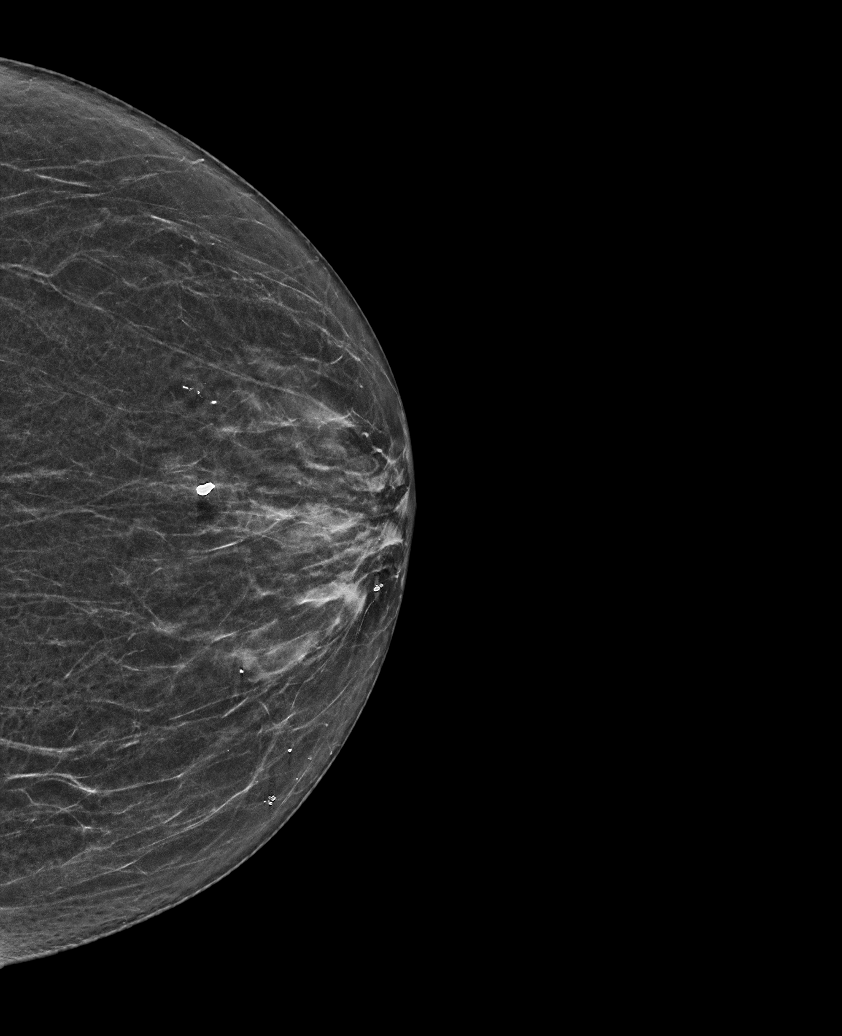

[R CC synth-2D]
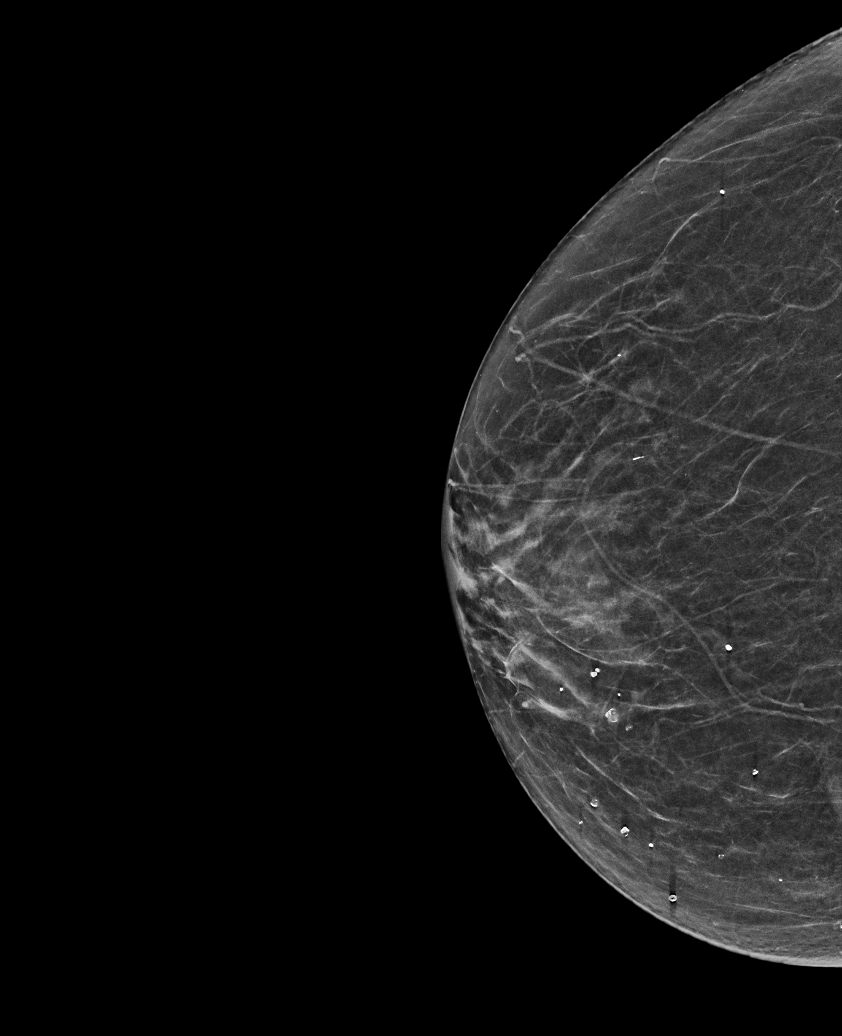

[L MLO synth-2D]
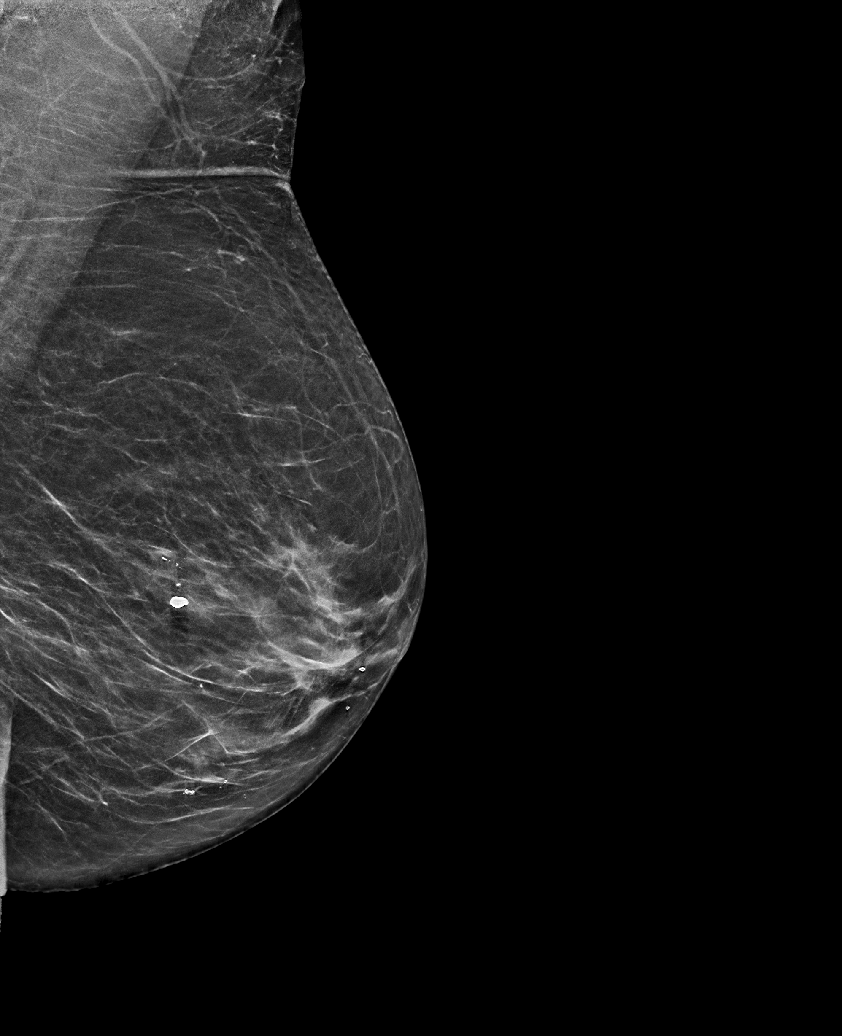

[R MLO synth-2D (2 of 2)]
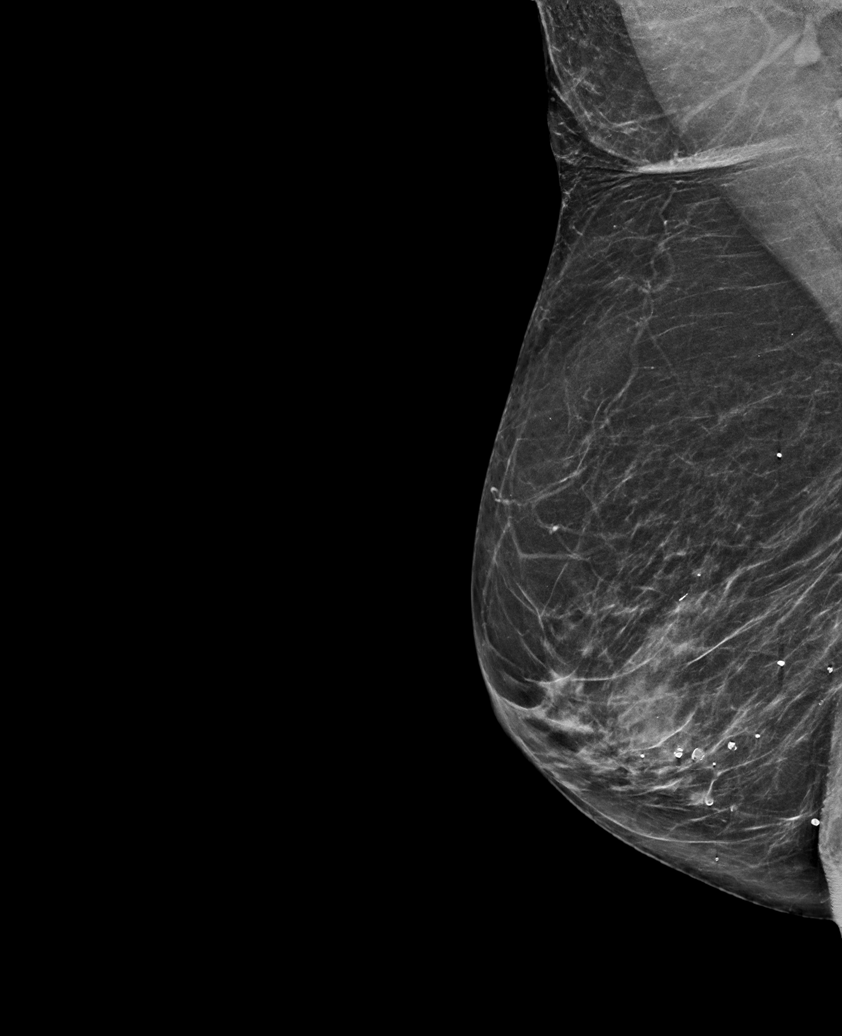

[R CC tomo · tomo slice 29/56.0]
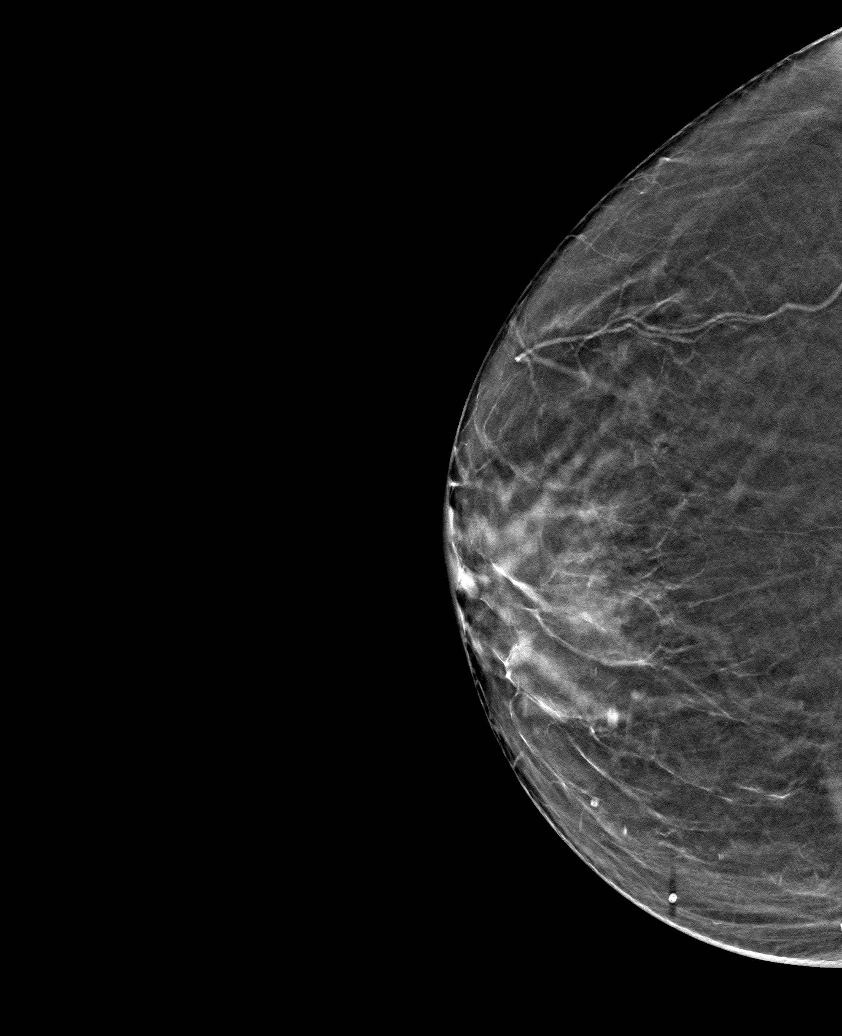

[6 of 30 positions shown; findings below may reference images not displayed]

ACR Breast Density Category b: There are scattered areas of
fibroglandular density.
FINDINGS: There are no findings suspicious for malignancy.
IMPRESSION: No mammographic evidence of malignancy. A result letter of this
screening mammogram will be mailed directly to the patient.

RECOMMENDATION:
Screening mammogram in one year. (Code:51-O-LD2)

BI-RADS CATEGORY  1: Negative.

## 2023-12-26 ENCOUNTER — Encounter: Admitting: Physician Assistant

## 2023-12-26 DIAGNOSIS — E11622 Type 2 diabetes mellitus with other skin ulcer: Secondary | ICD-10-CM | POA: Diagnosis not present

## 2024-01-03 ENCOUNTER — Encounter: Attending: Physician Assistant | Admitting: Physician Assistant

## 2024-01-03 DIAGNOSIS — I10 Essential (primary) hypertension: Secondary | ICD-10-CM | POA: Insufficient documentation

## 2024-01-03 DIAGNOSIS — E11622 Type 2 diabetes mellitus with other skin ulcer: Secondary | ICD-10-CM | POA: Diagnosis present

## 2024-01-03 DIAGNOSIS — I739 Peripheral vascular disease, unspecified: Secondary | ICD-10-CM | POA: Diagnosis not present

## 2024-01-03 DIAGNOSIS — D508 Other iron deficiency anemias: Secondary | ICD-10-CM | POA: Insufficient documentation

## 2024-01-03 DIAGNOSIS — L97822 Non-pressure chronic ulcer of other part of left lower leg with fat layer exposed: Secondary | ICD-10-CM | POA: Diagnosis not present

## 2024-01-10 ENCOUNTER — Encounter: Admitting: Physician Assistant

## 2024-01-10 DIAGNOSIS — E11622 Type 2 diabetes mellitus with other skin ulcer: Secondary | ICD-10-CM | POA: Diagnosis not present

## 2024-01-18 ENCOUNTER — Encounter: Admitting: Physician Assistant

## 2024-01-18 DIAGNOSIS — E11622 Type 2 diabetes mellitus with other skin ulcer: Secondary | ICD-10-CM | POA: Diagnosis not present

## 2024-01-25 ENCOUNTER — Encounter: Admitting: Physician Assistant

## 2024-01-25 DIAGNOSIS — E11622 Type 2 diabetes mellitus with other skin ulcer: Secondary | ICD-10-CM | POA: Diagnosis not present

## 2024-02-01 ENCOUNTER — Encounter: Admitting: Physician Assistant

## 2024-02-01 DIAGNOSIS — E11622 Type 2 diabetes mellitus with other skin ulcer: Secondary | ICD-10-CM | POA: Diagnosis not present

## 2024-02-08 ENCOUNTER — Encounter: Attending: Internal Medicine | Admitting: Internal Medicine

## 2024-02-08 ENCOUNTER — Other Ambulatory Visit: Payer: Self-pay

## 2024-02-08 DIAGNOSIS — I10 Essential (primary) hypertension: Secondary | ICD-10-CM | POA: Insufficient documentation

## 2024-02-08 DIAGNOSIS — D508 Other iron deficiency anemias: Secondary | ICD-10-CM | POA: Insufficient documentation

## 2024-02-08 DIAGNOSIS — D509 Iron deficiency anemia, unspecified: Secondary | ICD-10-CM

## 2024-02-08 DIAGNOSIS — I7389 Other specified peripheral vascular diseases: Secondary | ICD-10-CM | POA: Insufficient documentation

## 2024-02-08 DIAGNOSIS — L97822 Non-pressure chronic ulcer of other part of left lower leg with fat layer exposed: Secondary | ICD-10-CM | POA: Insufficient documentation

## 2024-02-08 DIAGNOSIS — E11622 Type 2 diabetes mellitus with other skin ulcer: Secondary | ICD-10-CM | POA: Insufficient documentation

## 2024-02-09 ENCOUNTER — Inpatient Hospital Stay: Admitting: Nurse Practitioner

## 2024-02-09 ENCOUNTER — Encounter: Payer: Self-pay | Admitting: Nurse Practitioner

## 2024-02-09 ENCOUNTER — Inpatient Hospital Stay

## 2024-02-09 ENCOUNTER — Ambulatory Visit

## 2024-02-15 ENCOUNTER — Encounter: Admitting: Physician Assistant

## 2024-02-22 ENCOUNTER — Encounter: Admitting: Physician Assistant

## 2024-02-28 ENCOUNTER — Encounter: Admitting: Physician Assistant

## 2024-03-14 ENCOUNTER — Encounter: Attending: Internal Medicine | Admitting: Internal Medicine

## 2024-03-14 DIAGNOSIS — D509 Iron deficiency anemia, unspecified: Secondary | ICD-10-CM | POA: Diagnosis not present

## 2024-03-14 DIAGNOSIS — I7389 Other specified peripheral vascular diseases: Secondary | ICD-10-CM | POA: Insufficient documentation

## 2024-03-14 DIAGNOSIS — I10 Essential (primary) hypertension: Secondary | ICD-10-CM | POA: Diagnosis not present

## 2024-03-14 DIAGNOSIS — E11622 Type 2 diabetes mellitus with other skin ulcer: Secondary | ICD-10-CM | POA: Diagnosis present

## 2024-03-14 DIAGNOSIS — L97822 Non-pressure chronic ulcer of other part of left lower leg with fat layer exposed: Secondary | ICD-10-CM | POA: Insufficient documentation

## 2024-03-21 ENCOUNTER — Encounter: Admitting: Internal Medicine

## 2024-03-21 DIAGNOSIS — E11622 Type 2 diabetes mellitus with other skin ulcer: Secondary | ICD-10-CM | POA: Diagnosis not present

## 2024-04-03 ENCOUNTER — Encounter: Admitting: Physician Assistant

## 2024-04-03 DIAGNOSIS — E11622 Type 2 diabetes mellitus with other skin ulcer: Secondary | ICD-10-CM | POA: Diagnosis not present

## 2024-04-11 ENCOUNTER — Encounter: Payer: Self-pay | Admitting: Oncology

## 2024-04-11 ENCOUNTER — Encounter: Admitting: Physician Assistant

## 2024-04-15 ENCOUNTER — Encounter: Payer: Self-pay | Admitting: Oncology

## 2024-04-18 ENCOUNTER — Encounter: Attending: Physician Assistant | Admitting: Physician Assistant

## 2024-04-18 DIAGNOSIS — I7389 Other specified peripheral vascular diseases: Secondary | ICD-10-CM | POA: Diagnosis not present

## 2024-04-18 DIAGNOSIS — D508 Other iron deficiency anemias: Secondary | ICD-10-CM | POA: Insufficient documentation

## 2024-04-18 DIAGNOSIS — E11622 Type 2 diabetes mellitus with other skin ulcer: Secondary | ICD-10-CM | POA: Diagnosis present

## 2024-04-18 DIAGNOSIS — I10 Essential (primary) hypertension: Secondary | ICD-10-CM | POA: Insufficient documentation

## 2024-04-18 DIAGNOSIS — L97822 Non-pressure chronic ulcer of other part of left lower leg with fat layer exposed: Secondary | ICD-10-CM | POA: Diagnosis not present

## 2024-04-21 NOTE — Progress Notes (Unsigned)
 "                                                                      MRN : [id number]  Erica Castillo is a 73 y.o. ([birth year]) female who presents with chief complaint of check circulation.  History of Present Illness:   The patient returns to the office for followup and review status post angiogram with intervention on 06/27/2023.    Procedure:   Percutaneous transluminal angioplasty and stent placement left superficial femoral and angioplasty of the mid popliteal arteries to 4 to 5 mm 2.     Percutaneous laser atherectomy with transluminal angioplasty of the anterior tibial artery with additional tibioperoneal trunk artery atherectomy and angioplasty   The patient notes improvement in the lower extremity symptoms. No interval shortening of the patient's claudication distance or rest pain symptoms. No new ulcers or wounds have occurred since the last visit.   There have been no significant changes to the patient's overall health care.   No documented history of amaurosis fugax or recent TIA symptoms. There are no recent neurological changes noted. No documented history of DVT, PE or superficial thrombophlebitis. The patient denies recent episodes of angina or shortness of breath.    ABI's Rt=1.12 and Lt=1.14  (previous ABI's Rt=1.04 and Lt=1.02).   Duplex ultrasound of the left lower extremity arterial system demonstrates patent system no evidence of hemodynamically significant stenosis with triphasic signals in the tibial arteries.  Active Medications[1]  Past Medical History:  Diagnosis Date   Anemia    HX of   Arthritis    Back problem    COVID-19    12/20   Diabetes mellitus without complication (HCC)    Gout    History of seizures    under control x 10 years   Hypercholesterolemia    Hypertension    Indigestion    Leg wound, left 2025   Vertigo    in past    Past Surgical History:  Procedure Laterality Date   ABDOMINAL HYSTERECTOMY     CATARACT EXTRACTION  W/PHACO Right 09/09/2019   Procedure: CATARACT EXTRACTION PHACO AND INTRAOCULAR LENS PLACEMENT (IOC) RIGHT DIABETIC 3.85  00:31.3;  Surgeon: Myrna Adine Anes, MD;  Location: [facility];  Service: Ophthalmology;  Laterality: Right;  Diabetic   CATARACT EXTRACTION W/PHACO Left 09/30/2019   Procedure: CATARACT EXTRACTION PHACO AND INTRAOCULAR LENS PLACEMENT (IOC) LEFT DIABETIC 2.69  00:28.7;  Surgeon: Myrna Adine Anes, MD;  Location: [facility];  Service: Ophthalmology;  Laterality: Left;  Diabetic - oral meds   CHOLECYSTECTOMY     COLONOSCOPY     COLONOSCOPY WITH PROPOFOL  N/A 04/06/2021   Procedure: COLONOSCOPY WITH PROPOFOL ;  Surgeon: Maryruth Ole DASEN, MD;  Location: [facility];  Service: Endoscopy;  Laterality: N/A;  DM   ESOPHAGOGASTRODUODENOSCOPY (EGD) WITH PROPOFOL  N/A 04/06/2021   Procedure: ESOPHAGOGASTRODUODENOSCOPY (EGD) WITH PROPOFOL ;  Surgeon: Maryruth Ole DASEN, MD;  Location: [facility];  Service: Endoscopy;  Laterality: N/A;   INCONTINENCE SURGERY     bladder suspension   LOWER EXTREMITY ANGIOGRAPHY Left 06/27/2023   Procedure: Lower Extremity Angiography;  Surgeon: Jama Cordella MATSU, MD;  Location: [facility];  Service: Cardiovascular;  Laterality: Left;   LOWER EXTREMITY INTERVENTION  Left 06/27/2023   Procedure: LOWER EXTREMITY INTERVENTION;  Surgeon: Jama Cordella MATSU, MD;  Location: [facility];  Service: Cardiovascular;  Laterality: Left;    Social History Social History[2]  Family History Family History  Problem Relation Age of Onset   Diabetes Mother    Hypertension Mother    Hypertension Father    Breast cancer Niece     Allergies[3]   REVIEW OF SYSTEMS (Negative unless checked)  Constitutional: [] Weight loss  [] Fever  [] Chills Cardiac: [] Chest pain   [] Chest pressure   [] Palpitations   [] Shortness of breath when laying flat   [] Shortness of breath with exertion. Vascular:  [x] Pain in legs with walking   [] Pain in  legs at rest  [] History of DVT   [] Phlebitis   [] Swelling in legs   [] Varicose veins   [] Non-healing ulcers Pulmonary:   [] Uses home oxygen   [] Productive cough   [] Hemoptysis   [] Wheeze  [] COPD   [] Asthma Neurologic:  [] Dizziness   [] Seizures   [] History of stroke   [] History of TIA  [] Aphasia   [] Vissual changes   [] Weakness or numbness in arm   [] Weakness or numbness in leg Musculoskeletal:   [] Joint swelling   [] Joint pain   [] Low back pain Hematologic:  [] Easy bruising  [] Easy bleeding   [] Hypercoagulable state   [] Anemic Gastrointestinal:  [] Diarrhea   [] Vomiting  [x] Gastroesophageal reflux/heartburn   [] Difficulty swallowing. Genitourinary:  [] Chronic kidney disease   [] Difficult urination  [] Frequent urination   [] Blood in urine Skin:  [] Rashes   [] Ulcers  Psychological:  [x] History of anxiety   []  History of major depression.  Physical Examination  There were no vitals filed for this visit. There is no height or weight on file to calculate BMI. Gen: WD/WN, NAD Head: [place]/AT, No temporalis wasting.  Ear/Nose/Throat: Hearing grossly intact, nares w/o erythema or drainage Eyes: PER, EOMI, sclera nonicteric.  Neck: Supple, no masses.  No bruit or JVD.  Pulmonary:  Good air movement, no audible wheezing, no use of accessory muscles.  Cardiac: RRR, normal S1, S2, no Murmurs. Vascular:  mild trophic changes, no open wounds Vessel Right Left  Radial Palpable Palpable  PT Not Palpable Not Palpable  DP Not Palpable Not Palpable  Gastrointestinal: soft, non-distended. No guarding/no peritoneal signs.  Musculoskeletal: M/S 5/5 throughout.  No visible deformity.  Neurologic: CN 2-12 intact. Pain and light touch intact in extremities.  Symmetrical.  Speech is fluent. Motor exam as listed above. Psychiatric: Judgment intact, Mood & affect appropriate for pt's clinical situation. Dermatologic: No rashes or ulcers noted.  No changes consistent with cellulitis.   CBC Lab Results  Component  Value Date   WBC 13.3 (H) 11/09/2023   HGB 11.8 (L) 11/09/2023   HCT 37.5 11/09/2023   MCV 96.2 11/09/2023   PLT 467 (H) 11/09/2023    BMET    Component Value Date/Time   NA 137 06/11/2023 1402   NA 139 01/24/2012 1213   K 2.8 (L) 06/11/2023 1402   K 4.1 01/24/2012 1213   CL 102 06/11/2023 1402   CL 104 01/24/2012 1213   CO2 27 06/11/2023 1402   CO2 27 01/24/2012 1213   GLUCOSE 143 (H) 06/11/2023 1402   GLUCOSE 129 (H) 01/24/2012 1213   BUN 10 06/27/2023 0853   BUN 10 01/24/2012 1213   CREATININE 0.61 06/27/2023 0853   CREATININE 0.85 01/24/2012 1213   CALCIUM 8.6 (L) 06/11/2023 1402   CALCIUM 9.0 01/24/2012 1213   GFRNONAA >60 06/27/2023  0853   GFRNONAA >60 01/24/2012 1213   GFRAA >60 01/24/2012 1213   CrCl cannot be calculated (Patient's most recent lab result is older than the maximum 21 days allowed.).  COAG No results found for: INR, PROTIME  Radiology No results found.   Assessment/Plan There are no diagnoses linked to this encounter.   Cordella Shawl, MD  04/21/2024 2:05 PM      [1]  No outpatient medications have been marked as taking for the 04/22/24 encounter (Appointment) with Shawl, Cordella MATSU, MD.  [2]  Social History Tobacco Use   Smoking status: Never   Smokeless tobacco: Never  Vaping Use   Vaping status: Never Used  Substance Use Topics   Alcohol use: No   Drug use: No  [3] No Known Allergies  "

## 2024-04-22 ENCOUNTER — Ambulatory Visit (INDEPENDENT_AMBULATORY_CARE_PROVIDER_SITE_OTHER)

## 2024-04-22 ENCOUNTER — Encounter (INDEPENDENT_AMBULATORY_CARE_PROVIDER_SITE_OTHER): Payer: Self-pay | Admitting: Vascular Surgery

## 2024-04-22 ENCOUNTER — Ambulatory Visit (INDEPENDENT_AMBULATORY_CARE_PROVIDER_SITE_OTHER): Admitting: Vascular Surgery

## 2024-04-22 VITALS — BP 136/76 | HR 84 | Resp 17 | Ht 63.0 in | Wt 150.0 lb

## 2024-04-22 DIAGNOSIS — E119 Type 2 diabetes mellitus without complications: Secondary | ICD-10-CM | POA: Diagnosis not present

## 2024-04-22 DIAGNOSIS — I10 Essential (primary) hypertension: Secondary | ICD-10-CM

## 2024-04-22 DIAGNOSIS — E782 Mixed hyperlipidemia: Secondary | ICD-10-CM

## 2024-04-22 DIAGNOSIS — I70213 Atherosclerosis of native arteries of extremities with intermittent claudication, bilateral legs: Secondary | ICD-10-CM | POA: Diagnosis not present

## 2024-04-25 LAB — VAS US ABI WITH/WO TBI
Left ABI: 1.04
Right ABI: 1.09

## 2024-05-02 ENCOUNTER — Encounter: Admitting: Physician Assistant

## 2024-05-02 ENCOUNTER — Telehealth: Payer: Self-pay

## 2024-05-02 DIAGNOSIS — E11622 Type 2 diabetes mellitus with other skin ulcer: Secondary | ICD-10-CM | POA: Diagnosis not present

## 2025-04-21 ENCOUNTER — Encounter (INDEPENDENT_AMBULATORY_CARE_PROVIDER_SITE_OTHER)

## 2025-04-21 ENCOUNTER — Ambulatory Visit (INDEPENDENT_AMBULATORY_CARE_PROVIDER_SITE_OTHER): Admitting: Vascular Surgery
